# Patient Record
Sex: Male | Born: 1960
Health system: Southern US, Community
[De-identification: ages and names within clinical notes are randomized; demographics above are authoritative.]

## PROBLEM LIST (undated history)

## (undated) ENCOUNTER — Emergency Department (HOSPITAL_COMMUNITY): Payer: Medicaid Other

## (undated) ENCOUNTER — Emergency Department: Discharge status: Absent without leave | Payer: No Typology Code available for payment source

## (undated) DIAGNOSIS — I10 Essential (primary) hypertension: Secondary | ICD-10-CM

## (undated) DIAGNOSIS — K219 Gastro-esophageal reflux disease without esophagitis: Secondary | ICD-10-CM

## (undated) DIAGNOSIS — Z720 Tobacco use: Secondary | ICD-10-CM

## (undated) DIAGNOSIS — E785 Hyperlipidemia, unspecified: Secondary | ICD-10-CM

## (undated) HISTORY — DX: Hyperlipidemia, unspecified: E78.5

## (undated) HISTORY — DX: Tobacco use: Z72.0

## (undated) HISTORY — DX: Essential (primary) hypertension: I10

## (undated) HISTORY — DX: Gastro-esophageal reflux disease without esophagitis: K21.9

---

## 1999-12-14 ENCOUNTER — Emergency Department (HOSPITAL_COMMUNITY): Admission: EM | Admit: 1999-12-14 | Discharge: 1999-12-14 | Payer: Self-pay | Admitting: Emergency Medicine

## 2006-06-05 ENCOUNTER — Ambulatory Visit: Payer: Self-pay | Admitting: *Deleted

## 2006-06-05 ENCOUNTER — Ambulatory Visit: Payer: Self-pay | Admitting: Family Medicine

## 2006-07-17 ENCOUNTER — Ambulatory Visit: Payer: Self-pay | Admitting: Family Medicine

## 2006-09-11 ENCOUNTER — Ambulatory Visit: Payer: Self-pay | Admitting: Family Medicine

## 2006-11-05 ENCOUNTER — Ambulatory Visit: Payer: Self-pay | Admitting: Family Medicine

## 2007-01-21 ENCOUNTER — Ambulatory Visit: Payer: Self-pay | Admitting: Family Medicine

## 2007-03-11 ENCOUNTER — Ambulatory Visit: Payer: Self-pay | Admitting: Family Medicine

## 2007-05-28 ENCOUNTER — Ambulatory Visit: Payer: Self-pay | Admitting: Family Medicine

## 2007-09-11 ENCOUNTER — Encounter (INDEPENDENT_AMBULATORY_CARE_PROVIDER_SITE_OTHER): Payer: Self-pay | Admitting: *Deleted

## 2007-11-15 ENCOUNTER — Ambulatory Visit: Payer: Self-pay | Admitting: Internal Medicine

## 2007-12-16 ENCOUNTER — Emergency Department (HOSPITAL_COMMUNITY): Admission: EM | Admit: 2007-12-16 | Discharge: 2007-12-16 | Payer: Self-pay | Admitting: Emergency Medicine

## 2008-01-13 ENCOUNTER — Ambulatory Visit: Payer: Self-pay | Admitting: Internal Medicine

## 2008-01-13 LAB — CONVERTED CEMR LAB
ALT: 40 units/L (ref 0–53)
AST: 22 units/L (ref 0–37)
CO2: 28 meq/L (ref 19–32)
Calcium: 9 mg/dL (ref 8.4–10.5)
Chloride: 101 meq/L (ref 96–112)
Cholesterol: 181 mg/dL (ref 0–200)
Sodium: 141 meq/L (ref 135–145)
Total Bilirubin: 0.7 mg/dL (ref 0.3–1.2)
Total Protein: 6.8 g/dL (ref 6.0–8.3)
VLDL: 17 mg/dL (ref 0–40)

## 2008-02-17 ENCOUNTER — Ambulatory Visit: Payer: Self-pay | Admitting: Internal Medicine

## 2008-05-11 ENCOUNTER — Ambulatory Visit: Payer: Self-pay | Admitting: Internal Medicine

## 2008-11-18 ENCOUNTER — Encounter (INDEPENDENT_AMBULATORY_CARE_PROVIDER_SITE_OTHER): Payer: Self-pay | Admitting: Adult Health

## 2008-11-18 ENCOUNTER — Ambulatory Visit: Payer: Self-pay | Admitting: Internal Medicine

## 2008-11-18 LAB — CONVERTED CEMR LAB
AST: 19 units/L (ref 0–37)
Albumin: 4.5 g/dL (ref 3.5–5.2)
BUN: 14 mg/dL (ref 6–23)
Basophils Relative: 1 % (ref 0–1)
CO2: 26 meq/L (ref 19–32)
Calcium: 9.5 mg/dL (ref 8.4–10.5)
Chloride: 99 meq/L (ref 96–112)
Cholesterol: 142 mg/dL (ref 0–200)
Creatinine, Ser: 0.99 mg/dL (ref 0.40–1.50)
Glucose, Bld: 142 mg/dL — ABNORMAL HIGH (ref 70–99)
HDL: 41 mg/dL (ref >39)
Hemoglobin: 16.1 g/dL (ref 13.0–17.0)
Lymphs Abs: 1.7 10*3/uL (ref 0.7–4.0)
MCHC: 33.6 g/dL (ref 30.0–36.0)
Monocytes Absolute: 0.4 10*3/uL (ref 0.1–1.0)
Monocytes Relative: 8 % (ref 3–12)
Neutro Abs: 2.7 10*3/uL (ref 1.7–7.7)
Potassium: 3.8 meq/L (ref 3.5–5.3)
RBC: 5.55 M/uL (ref 4.22–5.81)
Total CHOL/HDL Ratio: 3.5
Triglycerides: 124 mg/dL (ref <150)
WBC: 4.9 10*3/uL (ref 4.0–10.5)

## 2008-12-16 ENCOUNTER — Ambulatory Visit: Payer: Self-pay | Admitting: Internal Medicine

## 2009-01-28 ENCOUNTER — Encounter (INDEPENDENT_AMBULATORY_CARE_PROVIDER_SITE_OTHER): Payer: Self-pay | Admitting: Adult Health

## 2009-01-28 ENCOUNTER — Ambulatory Visit: Payer: Self-pay | Admitting: Internal Medicine

## 2009-01-28 LAB — CONVERTED CEMR LAB
Albumin: 4.3 g/dL (ref 3.5–5.2)
Alkaline Phosphatase: 94 units/L (ref 39–117)
BUN: 18 mg/dL (ref 6–23)
CO2: 25 meq/L (ref 19–32)
Calcium: 9 mg/dL (ref 8.4–10.5)
Chloride: 105 meq/L (ref 96–112)
Glucose, Bld: 111 mg/dL — ABNORMAL HIGH (ref 70–99)
HDL: 44 mg/dL (ref >39)
LDL Cholesterol: 74 mg/dL (ref 0–99)
PSA: 0.36 ng/mL (ref 0.10–4.00)
Potassium: 3.5 meq/L (ref 3.5–5.3)
Sodium: 142 meq/L (ref 135–145)
Total Protein: 7.2 g/dL (ref 6.0–8.3)
Triglycerides: 58 mg/dL (ref <150)

## 2009-05-17 ENCOUNTER — Ambulatory Visit: Payer: Self-pay | Admitting: Internal Medicine

## 2009-05-18 ENCOUNTER — Ambulatory Visit: Payer: Self-pay | Admitting: Internal Medicine

## 2009-07-01 ENCOUNTER — Ambulatory Visit: Payer: Self-pay | Admitting: Internal Medicine

## 2010-02-21 ENCOUNTER — Ambulatory Visit: Payer: Self-pay | Admitting: Internal Medicine

## 2010-02-21 ENCOUNTER — Encounter (INDEPENDENT_AMBULATORY_CARE_PROVIDER_SITE_OTHER): Payer: Self-pay | Admitting: Adult Health

## 2010-02-21 LAB — CONVERTED CEMR LAB
ALT: 34 units/L (ref 0–53)
AST: 28 units/L (ref 0–37)
Albumin: 4.7 g/dL (ref 3.5–5.2)
BUN: 16 mg/dL (ref 6–23)
CO2: 27 meq/L (ref 19–32)
Calcium: 9.8 mg/dL (ref 8.4–10.5)
Chloride: 100 meq/L (ref 96–112)
Cholesterol: 137 mg/dL (ref 0–200)
Creatinine, Ser: 0.85 mg/dL (ref 0.40–1.50)
Hgb A1c MFr Bld: 5.9 % (ref 4.6–6.1)
Potassium: 3.7 meq/L (ref 3.5–5.3)

## 2011-01-02 ENCOUNTER — Encounter (INDEPENDENT_AMBULATORY_CARE_PROVIDER_SITE_OTHER): Payer: Self-pay | Admitting: *Deleted

## 2011-01-02 LAB — CONVERTED CEMR LAB
ALT: 24 units/L (ref 0–53)
AST: 19 units/L (ref 0–37)
Albumin: 4.8 g/dL (ref 3.5–5.2)
Alkaline Phosphatase: 124 units/L — ABNORMAL HIGH (ref 39–117)
Calcium: 9.2 mg/dL (ref 8.4–10.5)
Chloride: 98 meq/L (ref 96–112)
LDL Cholesterol: 83 mg/dL (ref 0–99)
Potassium: 3.2 meq/L — ABNORMAL LOW (ref 3.5–5.3)
Sodium: 138 meq/L (ref 135–145)
Total Protein: 7.5 g/dL (ref 6.0–8.3)

## 2013-06-10 ENCOUNTER — Ambulatory Visit: Payer: No Typology Code available for payment source | Attending: Family Medicine | Admitting: Internal Medicine

## 2013-06-10 ENCOUNTER — Encounter: Payer: Self-pay | Admitting: Internal Medicine

## 2013-06-10 VITALS — BP 196/124 | HR 70 | Temp 98.4°F | Resp 15 | Ht 66.0 in | Wt 166.0 lb

## 2013-06-10 DIAGNOSIS — E785 Hyperlipidemia, unspecified: Secondary | ICD-10-CM

## 2013-06-10 DIAGNOSIS — R1013 Epigastric pain: Secondary | ICD-10-CM | POA: Insufficient documentation

## 2013-06-10 DIAGNOSIS — I10 Essential (primary) hypertension: Secondary | ICD-10-CM | POA: Insufficient documentation

## 2013-06-10 DIAGNOSIS — R109 Unspecified abdominal pain: Secondary | ICD-10-CM

## 2013-06-10 DIAGNOSIS — K219 Gastro-esophageal reflux disease without esophagitis: Secondary | ICD-10-CM

## 2013-06-10 DIAGNOSIS — Z720 Tobacco use: Secondary | ICD-10-CM | POA: Insufficient documentation

## 2013-06-10 DIAGNOSIS — F17200 Nicotine dependence, unspecified, uncomplicated: Secondary | ICD-10-CM | POA: Insufficient documentation

## 2013-06-10 MED ORDER — LISINOPRIL-HYDROCHLOROTHIAZIDE 20-25 MG PO TABS: 1.0000 | ORAL_TABLET | Freq: Every day | ORAL | Status: DC

## 2013-06-10 MED ORDER — RANITIDINE HCL 150 MG PO TABS: 150.0000 mg | ORAL_TABLET | Freq: Two times a day (BID) | ORAL | Status: DC

## 2013-06-10 NOTE — Progress Notes (Signed)
Patient complains of stomach ache past three days Stated had a bowel movement earlier today Denies fever

## 2013-06-10 NOTE — Progress Notes (Signed)
Patient ID: Frank Warren, male   DOB: [birth year], 52 y.o.   MRN: [id number] PCP:  No primary provider on file.    Chief Complaint:  Epigastric pain for 2-3 days  HPI: 52 year old male with hx of HTN, ? GERD and dyslipidemia presents to the clinic with symptoms of epigastric discomfort associated with 2-3 episodes of loose bowel movement for past 3 days. Patient reports of burning epigastric pain for this duration, it is nonradiating. Denies any nausea and vomiting. Denies symptoms similar to his acid reflux. He denied eating anything outside. Patient had 3 episodes of loose bowel movement for past 3 days without any blood. Denies any sick contacts. Denies fever, chills, headache, blurred vision, chest pain, palpitations, shortness of breath, bowel or urinary symptoms. Denies any joint pain. He reports having history of hypertension and being on lisinopril (does not remember dose and has a lot of prescription since almost 2 months after the [facility] at [address] closed ). He also reports history of dyslipidemia and acid reflux symptoms. He continues to smoke half a pack of cigarette per day.  Allergies: No Known Allergies  Medications prescribed this visit  Medication Sig Start Date End Date Taking? Authorizing Provider  lisinopril-hydrochlorothiazide (PRINZIDE,ZESTORETIC) 20-25 MG per tablet Take 1 tablet by mouth daily. 06/10/13   Jenniferann Stuckert, MD  ranitidine (ZANTAC) 150 MG tablet Take 1 tablet (150 mg total) by mouth 2 (two) times daily. 06/10/13   Eddie North, MD    Past Medical History  Diagnosis Date  . Hypertension   . Hyperlipidemia   . Acid reflux disease   . Dyslipidemia   . Tobacco abuse     History reviewed. No pertinent past surgical history.  Social History:  reports that he has been smoking Cigarettes.  He has been smoking about 0.50 packs per day. He does not have any smokeless tobacco history on file. His alcohol and drug histories are not on file.  History  reviewed. No pertinent family history.  Review of Systems:  Constitutional: Denies fever, chills, diaphoresis, appetite change and fatigue.  HEENT: Denies photophobia, eye pain, redness, hearing loss, ear pain, congestion, sore throat, rhinorrhea, sneezing, mouth sores, trouble swallowing, neck pain, neck stiffness and tinnitus.   Respiratory: Denies SOB, DOE, cough, chest tightness,  and wheezing.   Cardiovascular: Denies chest pain, palpitations and leg swelling.  Gastrointestinal: Epigastric pain with iarrhea, Denies nausea, vomiting,  constipation, blood in stool and abdominal distention.  Genitourinary: Denies dysuria, urgency, frequency, hematuria, flank pain and difficulty urinating.  Endocrine: Denies: hot or cold intolerance,  polyuria, polydipsia. Musculoskeletal: Denies myalgias, back pain, joint swelling, arthralgias. Neurological: Denies dizziness, seizures, syncope, weakness, light-headedness, numbness and headaches.  Psychiatric/Behavioral: Denies , mood changes, confusion, nervousness, sleep disturbance and agitation   Physical Exam:  Filed Vitals:   06/10/13 1848  BP: 196/124  Pulse: 70  Temp: 98.4 F (36.9 C)  Resp: 15  Height: 5\' 6"  (1.676 m)  Weight: 166 lb (75.297 kg)  SpO2: 97%    Constitutional: Vital signs reviewed.  Patient is a well-developed and well-nourished in no acute distress and cooperative with exam. Alert and oriented x3.  HEENT: No pallor, moist oral mucosa Chest: Clear to auscultation bilaterally, no added sounds CVS: Normal S1 and S2, no murmurs rub or gallop Abdomen: Soft, distended, bowel sounds present, mild epigastric tenderness Extremities: Warm, no edema CNS: AAO x3   No results found for this or any previous visit (from the past 48 hour(s)).  Radiological Exams  No results found.  Assessment/Plan Abdominal pain Appears to be related to GERD like symptoms. Has unexplained loose BM for 3 days which seems to be improving. Will  prescribe a course of ranitidine.   Accelerated hypertension Blood pressure markedly elevated.  No associated symptoms. I will prescribe him with a course of HCTZ-lisinopril (25- 20 mg) once daily. Patient has been off his blood pressure medications for almost 2 months now. He is also actively smoking. I would check for a CBC, BMET., lipid panel and hemoglobin A1c. I have encouraged and counseled  him to stop smoking.  follow up in 3 days for BP monitoring. He should report to ED if he has any symptoms of chest pain, shortness of breath, nausea, vomiting, dizziness, severe headache, hematuria  or blurred vision.  counseled on low salt diet  Dyslipidemia Will check lipid panel and A1C  i will ask to have records from [facility] at [address] set here.  Follow up in 3 days for BP monitoring   Time spent: 30 minutes  Marlyce Mcdougald 06/10/2013, 7:31 PM

## 2013-06-12 ENCOUNTER — Ambulatory Visit: Payer: No Typology Code available for payment source | Attending: Family Medicine | Admitting: Internal Medicine

## 2013-06-12 VITALS — BP 197/115 | HR 54 | Temp 97.9°F | Ht 69.0 in | Wt 166.9 lb

## 2013-06-12 DIAGNOSIS — I10 Essential (primary) hypertension: Secondary | ICD-10-CM

## 2013-06-12 LAB — COMPREHENSIVE METABOLIC PANEL
ALT: 15 U/L (ref 0–53)
Albumin: 4.2 g/dL (ref 3.5–5.2)
BUN: 10 mg/dL (ref 6–23)
CO2: 30 mEq/L (ref 19–32)
Calcium: 9.6 mg/dL (ref 8.4–10.5)
Chloride: 98 mEq/L (ref 96–112)
Creat: 1.01 mg/dL (ref 0.50–1.35)
Potassium: 3.2 mEq/L — ABNORMAL LOW (ref 3.5–5.3)

## 2013-06-12 LAB — LIPID PANEL
Cholesterol: 169 mg/dL (ref 0–200)
LDL Cholesterol: 110 mg/dL — ABNORMAL HIGH (ref 0–99)
Triglycerides: 84 mg/dL (ref <150)

## 2013-06-12 LAB — HEMOGLOBIN A1C: Hgb A1c MFr Bld: 5.6 % (ref <5.7)

## 2013-06-12 MED ORDER — CLONIDINE HCL 0.3 MG PO TABS: 0.3000 mg | ORAL_TABLET | Freq: Two times a day (BID) | ORAL | Status: DC

## 2013-06-12 NOTE — Progress Notes (Signed)
Patient ID: Frank Warren, male   DOB: 1961-08-23, 52 y.o.   MRN: [id number]   CC:  HPI: 52 year old male, with a history of GERD, dyslipidemia presents to the clinic for followup of blood pressure. The patient's blood pressure significantly elevated in the 190s. He showed me his medications and he used to be on Norvasc, lisinopril/HCTZ and atenolol. He recently started back on lisinopril HCTZ after a long gap. Today's blood pressure still significantly elevated. He denies any chest pain palpitations, shortness of breath   No Known Allergies Past Medical History  Diagnosis Date  . Hypertension   . Hyperlipidemia   . Acid reflux disease   . Dyslipidemia   . Tobacco abuse    Current Outpatient Prescriptions on File Prior to Visit  Medication Sig Dispense Refill  . lisinopril-hydrochlorothiazide (PRINZIDE,ZESTORETIC) 20-25 MG per tablet Take 1 tablet by mouth daily.  30 tablet  3  . ranitidine (ZANTAC) 150 MG tablet Take 1 tablet (150 mg total) by mouth 2 (two) times daily.  60 tablet  0   No current facility-administered medications on file prior to visit.   No family history on file. History   Social History  . Marital Status: Married    Spouse Name: N/A    Number of Children: N/A  . Years of Education: N/A   Occupational History  . Not on file.   Social History Main Topics  . Smoking status: Current Every Day Smoker -- 0.50 packs/day    Types: Cigarettes  . Smokeless tobacco: Not on file  . Alcohol Use: Not on file  . Drug Use: Not on file  . Sexually Active: Not on file   Other Topics Concern  . Not on file   Social History Narrative  . No narrative on file    Review of Systems  Constitutional: Negative for fever, chills, diaphoresis, activity change, appetite change and fatigue.  HENT: Negative for ear pain, nosebleeds, congestion, facial swelling, rhinorrhea, neck pain, neck stiffness and ear discharge.   Eyes: Negative for pain, discharge, redness, itching and  visual disturbance.  Respiratory: Negative for cough, choking, chest tightness, shortness of breath, wheezing and stridor.   Cardiovascular: Negative for chest pain, palpitations and leg swelling.  Gastrointestinal: Negative for abdominal distention.  Genitourinary: Negative for dysuria, urgency, frequency, hematuria, flank pain, decreased urine volume, difficulty urinating and dyspareunia.  Musculoskeletal: Negative for back pain, joint swelling, arthralgias and gait problem.  Neurological: Negative for dizziness, tremors, seizures, syncope, facial asymmetry, speech difficulty, weakness, light-headedness, numbness and headaches.  Hematological: Negative for adenopathy. Does not bruise/bleed easily.  Psychiatric/Behavioral: Negative for hallucinations, behavioral problems, confusion, dysphoric mood, decreased concentration and agitation.    Objective:   Filed Vitals:   06/12/13 0951  BP: 197/115  Pulse: 54  Temp: 97.9 F (36.6 C)    Physical Exam  Constitutional: Appears well-developed and well-nourished. No distress.  HENT: Normocephalic. External right and left ear normal. Oropharynx is clear and moist.  Eyes: Conjunctivae and EOM are normal. PERRLA, no scleral icterus.  Neck: Normal ROM. Neck supple. No JVD. No tracheal deviation. No thyromegaly.  CVS: RRR, S1/S2 +, no murmurs, no gallops, no carotid bruit.  Pulmonary: Effort and breath sounds normal, no stridor, rhonchi, wheezes, rales.  Abdominal: Soft. BS +,  no distension, tenderness, rebound or guarding.  Musculoskeletal: Normal range of motion. No edema and no tenderness.  Lymphadenopathy: No lymphadenopathy noted, cervical, inguinal. Neuro: Alert. Normal reflexes, muscle tone coordination. No cranial nerve deficit. Skin: Skin is  warm and dry. No rash noted. Not diaphoretic. No erythema. No pallor.  Psychiatric: Normal mood and affect. Behavior, judgment, thought content normal.   Lab Results  Component Value Date   WBC  4.9 11/18/2008   HGB 16.1 11/18/2008   HCT 47.9 11/18/2008   MCV 86.3 11/18/2008   PLT 190 11/18/2008   Lab Results  Component Value Date   CREATININE 0.86 01/02/2011   BUN 10 01/02/2011   NA 138 01/02/2011   K 3.2* 01/02/2011   CL 98 01/02/2011   CO2 28 01/02/2011    Lab Results  Component Value Date   HGBA1C 5.9 02/21/2010   Lipid Panel     Component Value Date/Time   CHOL 145 01/02/2011 2107   TRIG 72 01/02/2011 2107   HDL 48 01/02/2011 2107   CHOLHDL 3.0 Ratio 01/02/2011 2107   VLDL 14 01/02/2011 2107   LDLCALC 83 01/02/2011 2107       Assessment and plan:   Patient Active Problem List   Diagnosis Date Noted  . Abdominal  pain, other specified site 06/10/2013  . Accelerated hypertension 06/10/2013  . Dyslipidemia 06/10/2013  . Acid reflux disease 06/10/2013  . Tobacco abuse disorder 06/10/2013       Accelerated hypertension The patient is recently started on HCTZ and lisinopril Add clonidine. He has been advised about quitting smoking. I see that his labs were ordered but have not been done, will reorder blood work for today Repeat blood pressure check on Monday

## 2013-06-16 ENCOUNTER — Ambulatory Visit: Payer: No Typology Code available for payment source | Attending: Family Medicine

## 2013-06-16 ENCOUNTER — Other Ambulatory Visit: Payer: Self-pay | Admitting: Internal Medicine

## 2013-06-16 MED ORDER — CLONIDINE HCL 0.3 MG PO TABS: ORAL_TABLET | ORAL | Status: DC

## 2013-06-16 NOTE — Progress Notes (Unsigned)
Pt came in for bp recheck. Medication effective for bp. 167/93 . HR 50. Dr. Sherlon Handing notified. Pt to take 1/2 Catapress and return on Wednesday per order

## 2013-06-16 NOTE — Progress Notes (Signed)
Please note that due to bradycardia of 48 on today's exam we have decided to lower the dose of Clonidine to 1.5 mg tablet BID and to have pt come back in 2 days for vitals sign check. He may need to have referral to cardiology. Pt is otherwise asymptomatic on exam.  Debbora Presto, MD  [facility] Pager [phone]  If 7PM-7AM, please contact night-coverage [email or web address] Password [email or web address]

## 2013-06-18 ENCOUNTER — Ambulatory Visit: Payer: No Typology Code available for payment source | Attending: Family Medicine

## 2013-06-18 NOTE — Progress Notes (Unsigned)
Subjective:     Patient ID: Frank Warren, male   DOB: February 11, 1961, 52 y.o.   MRN: [id number]  HPI   Review of Systems     Objective:   Physical Exam     Assessment:     ***    Plan:     ***

## 2013-06-18 NOTE — Progress Notes (Unsigned)
PT HERE FOR BP CHECK WITH H/R. BP 169/106 65. PT TOLD TO CONTINUE TAKING BP MED AS PRESCRIBED AND RETURN ON Monday FOR OFFICE VISIT PER DR. Laural Benes. DENIES H/A,N,V,DIZZINESS

## 2013-06-23 ENCOUNTER — Ambulatory Visit: Payer: No Typology Code available for payment source

## 2013-06-25 ENCOUNTER — Ambulatory Visit: Payer: No Typology Code available for payment source | Attending: Family Medicine | Admitting: Internal Medicine

## 2013-06-25 VITALS — BP 171/109 | HR 51 | Temp 98.3°F | Resp 15 | Ht 71.0 in | Wt 167.0 lb

## 2013-06-25 DIAGNOSIS — I10 Essential (primary) hypertension: Secondary | ICD-10-CM

## 2013-06-25 MED ORDER — HYDRALAZINE HCL 25 MG PO TABS: 100.0000 mg | ORAL_TABLET | Freq: Three times a day (TID) | ORAL | Status: DC

## 2013-06-25 MED ORDER — NICOTINE 21 MG/24HR TD PT24: 1.0000 | MEDICATED_PATCH | TRANSDERMAL | Status: DC

## 2013-06-25 NOTE — Progress Notes (Unsigned)
Patient is here for follow up for HTN Today blood pressure is still elevated

## 2013-06-25 NOTE — Progress Notes (Unsigned)
Patient ID: Frank Warren, male   DOB: 12/19/1961, 52 y.o.   MRN: [id number]  CC:  HPI:  52 year old male, with a history of GERD, dyslipidemia presents to the clinic for followup of blood pressure. The patient's blood pressure significantly elevated in the 190s. He showed me his medications and he used to be on Norvasc, lisinopril/HCTZ and atenolol. He recently started back on lisinopril HCTZ after a long gap. He was initiated on clonidine. With the clonidine the patient's heart rate dropped to 48. Today his heart rate is 51 after cutting back on the clonidine to half the dose. Patient's labs reflect hypokalemia. Today's blood pressure still significantly elevated. He denies any chest pain palpitations, shortness of breath   No Known Allergies Past Medical History  Diagnosis Date  . Hypertension   . Hyperlipidemia   . Acid reflux disease   . Dyslipidemia   . Tobacco abuse    Current Outpatient Prescriptions on File Prior to Visit  Medication Sig Dispense Refill  . cloNIDine (CATAPRES) 0.3 MG tablet Take half tablet twice daily  60 tablet  2  . lisinopril-hydrochlorothiazide (PRINZIDE,ZESTORETIC) 20-25 MG per tablet Take 1 tablet by mouth daily.  30 tablet  3  . ranitidine (ZANTAC) 150 MG tablet Take 1 tablet (150 mg total) by mouth 2 (two) times daily.  60 tablet  0   No current facility-administered medications on file prior to visit.   History reviewed. No pertinent family history. History   Social History  . Marital Status: Married    Spouse Name: N/A    Number of Children: N/A  . Years of Education: N/A   Occupational History  . Not on file.   Social History Main Topics  . Smoking status: Current Every Day Smoker -- 0.50 packs/day    Types: Cigarettes  . Smokeless tobacco: Not on file  . Alcohol Use: Not on file  . Drug Use: Not on file  . Sexually Active: Not on file   Other Topics Concern  . Not on file   Social History Narrative  . No narrative on file     Review of Systems  Constitutional: Negative for fever, chills, diaphoresis, activity change, appetite change and fatigue.  HENT: Negative for ear pain, nosebleeds, congestion, facial swelling, rhinorrhea, neck pain, neck stiffness and ear discharge.   Eyes: Negative for pain, discharge, redness, itching and visual disturbance.  Respiratory: Negative for cough, choking, chest tightness, shortness of breath, wheezing and stridor.   Cardiovascular: Negative for chest pain, palpitations and leg swelling.  Gastrointestinal: Negative for abdominal distention.  Genitourinary: Negative for dysuria, urgency, frequency, hematuria, flank pain, decreased urine volume, difficulty urinating and dyspareunia.  Musculoskeletal: Negative for back pain, joint swelling, arthralgias and gait problem.  Neurological: Negative for dizziness, tremors, seizures, syncope, facial asymmetry, speech difficulty, weakness, light-headedness, numbness and headaches.  Hematological: Negative for adenopathy. Does not bruise/bleed easily.  Psychiatric/Behavioral: Negative for hallucinations, behavioral problems, confusion, dysphoric mood, decreased concentration and agitation.    Objective:   Filed Vitals:   06/25/13 1528  BP: 171/109  Pulse: 51  Temp: 98.3 F (36.8 C)  Resp: 15    Physical Exam  Constitutional: Appears well-developed and well-nourished. No distress.  HENT: Normocephalic. External right and left ear normal. Oropharynx is clear and moist.  Eyes: Conjunctivae and EOM are normal. PERRLA, no scleral icterus.  Neck: Normal ROM. Neck supple. No JVD. No tracheal deviation. No thyromegaly.  CVS: RRR, S1/S2 +, no murmurs, no gallops, no carotid bruit.  Pulmonary: Effort and breath sounds normal, no stridor, rhonchi, wheezes, rales.  Abdominal: Soft. BS +,  no distension, tenderness, rebound or guarding.  Musculoskeletal: Normal range of motion. No edema and no tenderness.  Lymphadenopathy: No  lymphadenopathy noted, cervical, inguinal. Neuro: Alert. Normal reflexes, muscle tone coordination. No cranial nerve deficit. Skin: Skin is warm and dry. No rash noted. Not diaphoretic. No erythema. No pallor.  Psychiatric: Normal mood and affect. Behavior, judgment, thought content normal.   Lab Results  Component Value Date   WBC 4.9 11/18/2008   HGB 16.1 11/18/2008   HCT 47.9 11/18/2008   MCV 86.3 11/18/2008   PLT 190 11/18/2008   Lab Results  Component Value Date   CREATININE 1.01 06/12/2013   BUN 10 06/12/2013   NA 138 06/12/2013   K 3.2* 06/12/2013   CL 98 06/12/2013   CO2 30 06/12/2013    Lab Results  Component Value Date   HGBA1C 5.6 06/12/2013   Lipid Panel     Component Value Date/Time   CHOL 169 06/12/2013 1021   TRIG 84 06/12/2013 1021   HDL 42 06/12/2013 1021   CHOLHDL 4.0 06/12/2013 1021   VLDL 17 06/12/2013 1021   LDLCALC 110* 06/12/2013 1021       Assessment and plan:   Patient Active Problem List   Diagnosis Date Noted  . Abdominal  pain, other specified site 06/10/2013  . Accelerated hypertension 06/10/2013  . Dyslipidemia 06/10/2013  . Acid reflux disease 06/10/2013  . Tobacco abuse disorder 06/10/2013       Accelerated hypertension Patient Frank Warren still uncontrolled Will add hydralazine 100 mg 3 times a day Patient advised to come back for blood pressure check in a one-week In the meantime we'll evaluate for resistant hypertension, #1 MRA to evaluate for renal artery stenosis #2 check TSH, repeat BMP, and renin/aldosterone ratio Followup in one week with provider

## 2013-06-26 LAB — TSH: TSH: 0.696 u[IU]/mL (ref 0.350–4.500)

## 2013-06-26 LAB — BASIC METABOLIC PANEL
CO2: 28 mEq/L (ref 19–32)
Calcium: 9.2 mg/dL (ref 8.4–10.5)
Creat: 0.98 mg/dL (ref 0.50–1.35)
Sodium: 140 mEq/L (ref 135–145)

## 2013-07-01 ENCOUNTER — Ambulatory Visit (HOSPITAL_COMMUNITY)
Admission: RE | Admit: 2013-07-01 | Discharge: 2013-07-01 | Disposition: A | Discharge status: Home / Self Care | Payer: No Typology Code available for payment source | Source: Ambulatory Visit | Attending: Internal Medicine | Admitting: Internal Medicine

## 2013-07-01 DIAGNOSIS — K7689 Other specified diseases of liver: Secondary | ICD-10-CM | POA: Insufficient documentation

## 2013-07-01 DIAGNOSIS — I10 Essential (primary) hypertension: Secondary | ICD-10-CM | POA: Insufficient documentation

## 2013-07-01 LAB — ACTH STIMULATION, 3 TIME POINTS

## 2013-07-02 ENCOUNTER — Ambulatory Visit: Payer: No Typology Code available for payment source | Attending: Family Medicine | Admitting: Family Medicine

## 2013-07-02 VITALS — BP 172/105 | HR 56 | Temp 98.0°F | Resp 16 | Wt 167.6 lb

## 2013-07-02 DIAGNOSIS — Z79899 Other long term (current) drug therapy: Secondary | ICD-10-CM | POA: Insufficient documentation

## 2013-07-02 DIAGNOSIS — K219 Gastro-esophageal reflux disease without esophagitis: Secondary | ICD-10-CM

## 2013-07-02 DIAGNOSIS — Z720 Tobacco use: Secondary | ICD-10-CM

## 2013-07-02 DIAGNOSIS — Z09 Encounter for follow-up examination after completed treatment for conditions other than malignant neoplasm: Secondary | ICD-10-CM | POA: Insufficient documentation

## 2013-07-02 DIAGNOSIS — I10 Essential (primary) hypertension: Secondary | ICD-10-CM

## 2013-07-02 DIAGNOSIS — F17200 Nicotine dependence, unspecified, uncomplicated: Secondary | ICD-10-CM | POA: Insufficient documentation

## 2013-07-02 DIAGNOSIS — E785 Hyperlipidemia, unspecified: Secondary | ICD-10-CM

## 2013-07-02 NOTE — Progress Notes (Signed)
Patient ID: Frank Warren, male   DOB: 1961/06/04, 52 y.o.   MRN: [id number]  [id number] hypertension  HPI: The patient reports that he has not been able to take his medications for blood pressure as prescribed because he has been fasting 8-10 hours per day for the last several days.  He is celebrating a lid use holiday.  He reports no chest pain or shortness of breath.  No fever chills nausea or vomiting.  He is still smoking cigarettes and reports that he was not able to afford the nicotine patches. The abdominal pain has resolved. No Known Allergies Past Medical History  Diagnosis Date  . Hypertension   . Hyperlipidemia   . Acid reflux disease   . Dyslipidemia   . Tobacco abuse    Current Outpatient Prescriptions on File Prior to Visit  Medication Sig Dispense Refill  . cloNIDine (CATAPRES) 0.3 MG tablet Take half tablet twice daily  60 tablet  2  . hydrALAZINE (APRESOLINE) 25 MG tablet Take 4 tablets (100 mg total) by mouth 3 (three) times daily.  90 tablet  3  . lisinopril-hydrochlorothiazide (PRINZIDE,ZESTORETIC) 20-25 MG per tablet Take 1 tablet by mouth daily.  30 tablet  3  . nicotine (EQL NICOTINE) 21 mg/24hr patch Place 1 patch onto the skin daily.  28 patch  0  . ranitidine (ZANTAC) 150 MG tablet Take 1 tablet (150 mg total) by mouth 2 (two) times daily.  60 tablet  0   No current facility-administered medications on file prior to visit.   History reviewed. No pertinent family history. History   Social History  . Marital Status: Married    Spouse Name: N/A    Number of Children: N/A  . Years of Education: N/A   Occupational History  . Not on file.   Social History Main Topics  . Smoking status: Current Every Day Smoker -- 0.50 packs/day    Types: Cigarettes  . Smokeless tobacco: Not on file  . Alcohol Use: Not on file  . Drug Use: Not on file  . Sexually Active: Not on file   Other Topics Concern  . Not on file   Social History Narrative  . No narrative on  file    Review of Systems  Constitutional: Negative for fever, chills, diaphoresis, activity change, appetite change and fatigue.  HENT: Negative for ear pain, nosebleeds, congestion, facial swelling, rhinorrhea, neck pain, neck stiffness and ear discharge.   Eyes: Negative for pain, discharge, redness, itching and visual disturbance.  Respiratory: Negative for cough, choking, chest tightness, shortness of breath, wheezing and stridor.   Cardiovascular: Negative for chest pain, palpitations and leg swelling.  Gastrointestinal: Negative for abdominal distention.  Genitourinary: Negative for dysuria, urgency, frequency, hematuria, flank pain, decreased urine volume, difficulty urinating and dyspareunia.  Musculoskeletal: Negative for back pain, joint swelling, arthralgias and gait problem.  Neurological: Negative for dizziness, tremors, seizures, syncope, facial asymmetry, speech difficulty, weakness, light-headedness, numbness and headaches.  Hematological: Negative for adenopathy. Does not bruise/bleed easily.  Psychiatric/Behavioral: Negative for hallucinations, behavioral problems, confusion, dysphoric mood, decreased concentration and agitation.    Objective:   Filed Vitals:   07/02/13 1512  BP: 172/105  Pulse: 56  Temp: 98 F (36.7 C)  Resp: 16    Physical Exam  Constitutional: Appears well-developed and well-nourished. No distress.  HENT: Normocephalic. External right and left ear normal. Oropharynx is clear and moist.  Eyes: Conjunctivae and EOM are normal. PERRLA, no scleral icterus.  Neck: Normal ROM. Neck  supple. No JVD. No tracheal deviation. No thyromegaly.  CVS: RRR, S1/S2 +, no murmurs, no gallops, no carotid bruit.  Pulmonary: Effort and breath sounds normal, no stridor, rhonchi, wheezes, rales.  Abdominal: Soft. BS +,  no distension, tenderness, rebound or guarding.  Musculoskeletal: Normal range of motion. No edema and no tenderness.  Lymphadenopathy: No  lymphadenopathy noted, cervical, inguinal. Neuro: Alert. Normal reflexes, muscle tone coordination. No cranial nerve deficit. Skin: Skin is warm and dry. No rash noted. Not diaphoretic. No erythema. No pallor.  Psychiatric: Normal mood and affect. Behavior, judgment, thought content normal.   Lab Results  Component Value Date   WBC 4.9 11/18/2008   HGB 16.1 11/18/2008   HCT 47.9 11/18/2008   MCV 86.3 11/18/2008   PLT 190 11/18/2008   Lab Results  Component Value Date   CREATININE 0.98 06/25/2013   BUN 11 06/25/2013   NA 140 06/25/2013   K 3.7 06/25/2013   CL 99 06/25/2013   CO2 28 06/25/2013    Lab Results  Component Value Date   HGBA1C 5.6 06/12/2013   Lipid Panel     Component Value Date/Time   CHOL 169 06/12/2013 1021   TRIG 84 06/12/2013 1021   HDL 42 06/12/2013 1021   CHOLHDL 4.0 06/12/2013 1021   VLDL 17 06/12/2013 1021   LDLCALC 110* 06/12/2013 1021       Assessment and plan:   Patient Active Problem List   Diagnosis Date Noted  . Abdominal  pain, other specified site 06/10/2013  . Accelerated hypertension 06/10/2013  . Dyslipidemia 06/10/2013  . Acid reflux disease 06/10/2013  . Tobacco abuse disorder 06/10/2013   Continue current treatment plan as pt not taking BP meds as prescribed.  Continue monitoring blood pressure at home and reporting to [place] in 2 weeks' time.  The patient was counseled on the dangers of tobacco use, and was advised to quit.  Reviewed strategies to maximize success, including removing cigarettes and smoking materials from environment and written materials.  The patient was given clear instructions to go to ER or return to medical center if symptoms don't improve, worsen or new problems develop.  The patient verbalized understanding.  The patient was told to call to get any lab results if not heard anything in the next week.    RTC in 2 months  Rodney Langton, MD, CDE, FAAFP [facility] [place], [place]

## 2013-07-02 NOTE — Progress Notes (Signed)
Patient here for follow up on his blood pressure Elevated today 172/105

## 2013-07-02 NOTE — Patient Instructions (Signed)
Smoking Cessation, Tips for Success YOU CAN QUIT SMOKING If you are ready to quit smoking, congratulations! You have chosen to help yourself be healthier. Cigarettes bring nicotine, tar, carbon monoxide, and other irritants into your body. Your lungs, heart, and blood vessels will be able to work better without these poisons. There are many different ways to quit smoking. Nicotine gum, nicotine patches, a nicotine inhaler, or nicotine nasal spray can help with physical craving. Hypnosis, support groups, and medicines help break the habit of smoking. Here are some tips to help you quit for good.  Throw away all cigarettes.  Clean and remove all ashtrays from your home, work, and car.  On a card, write down your reasons for quitting. Carry the card with you and read it when you get the urge to smoke.  Cleanse your body of nicotine. Drink enough water and fluids to keep your urine clear or pale yellow. Do this after quitting to flush the nicotine from your body.  Learn to predict your moods. Do not let a bad situation be your excuse to have a cigarette. Some situations in your life might tempt you into wanting a cigarette.  Never have "just one" cigarette. It leads to wanting another and another. Remind yourself of your decision to quit.  Change habits associated with smoking. If you smoked while driving or when feeling stressed, try other activities to replace smoking. Stand up when drinking your coffee. Brush your teeth after eating. Sit in a different chair when you read the paper. Avoid alcohol while trying to quit, and try to drink fewer caffeinated beverages. Alcohol and caffeine may urge you to smoke.  Avoid foods and drinks that can trigger a desire to smoke, such as sugary or spicy foods and alcohol.  Ask people who smoke not to smoke around you.  Have something planned to do right after eating or having a cup of coffee. Take a walk or exercise to perk you up. This will help to keep you  from overeating.  Try a relaxation exercise to calm you down and decrease your stress. Remember, you may be tense and nervous for the first 2 weeks after you quit, but this will pass.  Find new activities to keep your hands busy. Play with a pen, coin, or rubber band. Doodle or draw things on paper.  Brush your teeth right after eating. This will help cut down on the craving for the taste of tobacco after meals. You can try mouthwash, too.  Use oral substitutes, such as lemon drops, carrots, a cinnamon stick, or chewing gum, in place of cigarettes. Keep them handy so they are available when you have the urge to smoke.  When you have the urge to smoke, try deep breathing.  Designate your home as a nonsmoking area.  If you are a heavy smoker, ask your caregiver about a prescription for nicotine chewing gum. It can ease your withdrawal from nicotine.  Reward yourself. Set aside the cigarette money you save and buy yourself something nice.  Look for support from others. Join a support group or smoking cessation program. Ask someone at home or at work to help you with your plan to quit smoking.  Always ask yourself, "Do I need this cigarette or is this just a reflex?" Tell yourself, "Today, I choose not to smoke," or "I do not want to smoke." You are reminding yourself of your decision to quit, even if you do smoke a cigarette. HOW WILL I FEEL WHEN   I QUIT SMOKING?  The benefits of not smoking start within days of quitting.  You may have symptoms of withdrawal because your body is used to nicotine (the addictive substance in cigarettes). You may crave cigarettes, be irritable, feel very hungry, cough often, get headaches, or have difficulty concentrating.  The withdrawal symptoms are only temporary. They are strongest when you first quit but will go away within 10 to 14 days.  When withdrawal symptoms occur, stay in control. Think about your reasons for quitting. Remind yourself that these are  signs that your body is healing and getting used to being without cigarettes.  Remember that withdrawal symptoms are easier to treat than the major diseases that smoking can cause.  Even after the withdrawal is over, expect periodic urges to smoke. However, these cravings are generally short-lived and will go away whether you smoke or not. Do not smoke!  If you relapse and smoke again, do not lose hope. Most smokers quit 3 times before they are successful.  If you relapse, do not give up! Plan ahead and think about what you will do the next time you get the urge to smoke. LIFE AS A NONSMOKER: MAKE IT FOR A MONTH, MAKE IT FOR LIFE Day 1: Hang this page where you will see it every day. Day 2: Get rid of all ashtrays, matches, and lighters. Day 3: Drink water. Breathe deeply between sips. Day 4: Avoid places with smoke-filled air, such as bars, clubs, or the smoking section of restaurants. Day 5: Keep track of how much money you save by not smoking. Day 6: Avoid boredom. Keep a good book with you or go to the movies. Day 7: Reward yourself! One week without smoking! Day 8: Make a dental appointment to get your teeth cleaned. Day 9: Decide how you will turn down a cigarette before it is offered to you. Day 10: Review your reasons for quitting. Day 11: Distract yourself. Stay active to keep your mind off smoking and to relieve tension. Take a walk, exercise, read a book, do a crossword puzzle, or try a new hobby. Day 12: Exercise. Get off the bus before your stop or use stairs instead of escalators. Day 13: Call on friends for support and encouragement. Day 14: Reward yourself! Two weeks without smoking! Day 15: Practice deep breathing exercises. Day 16: Bet a friend that you can stay a nonsmoker. Day 17: Ask to sit in nonsmoking sections of restaurants. Day 18: Hang up "No Smoking" signs. Day 19: Think of yourself as a nonsmoker. Day 20: Each morning, tell yourself you will not smoke. Day  21: Reward yourself! Three weeks without smoking! Day 22: Think of smoking in negative ways. Remember how it stains your teeth, gives you bad breath, and leaves you short of breath. Day 23: Eat a nutritious breakfast. Day 24:Do not relive your days as a smoker. Day 25: Hold a pencil in your hand when talking on the telephone. Day 26: Tell all your friends you do not smoke. Day 27: Think about how much better food tastes. Day 28: Remember, one cigarette is one too many. Day 29: Take up a hobby that will keep your hands busy. Day 30: Congratulations! One month without smoking! Give yourself a big reward. Your caregiver can direct you to community resources or hospitals for support, which may include:  Group support.  Education.  Hypnosis.  Subliminal therapy. Document Released: 09/08/2004 Document Revised: 03/04/2012 Document Reviewed: 09/27/2009 [facility] Patient Information 2014 [place], [place]. Smoking  Cessation Quitting smoking is important to your health and has many advantages. However, it is not always easy to quit since nicotine is a very addictive drug. Often times, people try 3 times or more before being able to quit. This document explains the best ways for you to prepare to quit smoking. Quitting takes hard work and a lot of effort, but you can do it. ADVANTAGES OF QUITTING SMOKING  You will live longer, feel better, and live better.  Your body will feel the impact of quitting smoking almost immediately.  Within 20 minutes, blood pressure decreases. Your pulse returns to its normal level.  After 8 hours, carbon monoxide levels in the blood return to normal. Your oxygen level increases.  After 24 hours, the chance of having a heart attack starts to decrease. Your breath, hair, and body stop smelling like smoke.  After 48 hours, damaged nerve endings begin to recover. Your sense of taste and smell improve.  After 72 hours, the body is virtually free of nicotine. Your  bronchial tubes relax and breathing becomes easier.  After 2 to 12 weeks, lungs can hold more air. Exercise becomes easier and circulation improves.  The risk of having a heart attack, stroke, cancer, or lung disease is greatly reduced.  After 1 year, the risk of coronary heart disease is cut in half.  After 5 years, the risk of stroke falls to the same as a nonsmoker.  After 10 years, the risk of lung cancer is cut in half and the risk of other cancers decreases significantly.  After 15 years, the risk of coronary heart disease drops, usually to the level of a nonsmoker.  If you are pregnant, quitting smoking will improve your chances of having a healthy baby.  The people you live with, especially any children, will be healthier.  You will have extra money to spend on things other than cigarettes. QUESTIONS TO THINK ABOUT BEFORE ATTEMPTING TO QUIT You may want to talk about your answers with your caregiver.  Why do you want to quit?  If you tried to quit in the past, what helped and what did not?  What will be the most difficult situations for you after you quit? How will you plan to handle them?  Who can help you through the tough times? Your family? Friends? A caregiver?  What pleasures do you get from smoking? What ways can you still get pleasure if you quit? Here are some questions to ask your caregiver:  How can you help me to be successful at quitting?  What medicine do you think would be best for me and how should I take it?  What should I do if I need more help?  What is smoking withdrawal like? How can I get information on withdrawal? GET READY  Set a quit date.  Change your environment by getting rid of all cigarettes, ashtrays, matches, and lighters in your home, car, or work. Do not let people smoke in your home.  Review your past attempts to quit. Think about what worked and what did not. GET SUPPORT AND ENCOURAGEMENT You have a better chance of being  successful if you have help. You can get support in many ways.  Tell your family, friends, and co-workers that you are going to quit and need their support. Ask them not to smoke around you.  Get individual, group, or telephone counseling and support. Programs are available at [employer] and health centers. Call your local health department for  information about programs in your area.  Spiritual beliefs and practices may help some smokers quit.  Download a "quit meter" on your computer to keep track of quit statistics, such as how long you have gone without smoking, cigarettes not smoked, and money saved.  Get a self-help book about quitting smoking and staying off of tobacco. LEARN NEW SKILLS AND BEHAVIORS  Distract yourself from urges to smoke. Talk to someone, go for a walk, or occupy your time with a task.  Change your normal routine. Take a different route to work. Drink tea instead of coffee. Eat breakfast in a different place.  Reduce your stress. Take a hot bath, exercise, or read a book.  Plan something enjoyable to do every day. Reward yourself for not smoking.  Explore interactive web-based programs that specialize in helping you quit. GET MEDICINE AND USE IT CORRECTLY Medicines can help you stop smoking and decrease the urge to smoke. Combining medicine with the above behavioral methods and support can greatly increase your chances of successfully quitting smoking.  Nicotine replacement therapy helps deliver nicotine to your body without the negative effects and risks of smoking. Nicotine replacement therapy includes nicotine gum, lozenges, inhalers, nasal sprays, and skin patches. Some may be available over-the-counter and others require a prescription.  Antidepressant medicine helps people abstain from smoking, but how this works is unknown. This medicine is available by prescription.  Nicotinic receptor partial agonist medicine simulates the effect of nicotine in your  brain. This medicine is available by prescription. Ask your caregiver for advice about which medicines to use and how to use them based on your health history. Your caregiver will tell you what side effects to look out for if you choose to be on a medicine or therapy. Carefully read the information on the package. Do not use any other product containing nicotine while using a nicotine replacement product.  RELAPSE OR DIFFICULT SITUATIONS Most relapses occur within the first 3 months after quitting. Do not be discouraged if you start smoking again. Remember, most people try several times before finally quitting. You may have symptoms of withdrawal because your body is used to nicotine. You may crave cigarettes, be irritable, feel very hungry, cough often, get headaches, or have difficulty concentrating. The withdrawal symptoms are only temporary. They are strongest when you first quit, but they will go away within 10 14 days. To reduce the chances of relapse, try to:  Avoid drinking alcohol. Drinking lowers your chances of successfully quitting.  Reduce the amount of caffeine you consume. Once you quit smoking, the amount of caffeine in your body increases and can give you symptoms, such as a rapid heartbeat, sweating, and anxiety.  Avoid smokers because they can make you want to smoke.  Do not let weight gain distract you. Many smokers will gain weight when they quit, usually less than 10 pounds. Eat a healthy diet and stay active. You can always lose the weight gained after you quit.  Find ways to improve your mood other than smoking. FOR MORE INFORMATION  www.smokefree.gov  Document Released: 12/05/2001 Document Revised: 06/11/2012 Document Reviewed: 03/21/2012 [facility] Patient Information 2014 [place], [place]. DASH Diet The DASH diet stands for "Dietary Approaches to Stop Hypertension." It is a healthy eating plan that has been shown to reduce high blood pressure (hypertension) in as little as  14 days, while also possibly providing other significant health benefits. These other health benefits include reducing the risk of breast cancer after menopause and reducing the  risk of type 2 diabetes, heart disease, colon cancer, and stroke. Health benefits also include weight loss and slowing kidney failure in patients with chronic kidney disease.  DIET GUIDELINES  Limit salt (sodium). Your diet should contain less than 1500 mg of sodium daily.  Limit refined or processed carbohydrates. Your diet should include mostly whole grains. Desserts and added sugars should be used sparingly.  Include small amounts of heart-healthy fats. These types of fats include nuts, oils, and tub margarine. Limit saturated and trans fats. These fats have been shown to be harmful in the body. CHOOSING FOODS  The following food groups are based on a 2000 calorie diet. See your Registered Dietitian for individual calorie needs. Grains and Grain Products (6 to 8 servings daily)  Eat More Often: Whole-wheat bread, brown rice, whole-grain or wheat pasta, quinoa, popcorn without added fat or salt (air popped).  Eat Less Often: White bread, white pasta, white rice, cornbread. Vegetables (4 to 5 servings daily)  Eat More Often: Fresh, frozen, and canned vegetables. Vegetables may be raw, steamed, roasted, or grilled with a minimal amount of fat.  Eat Less Often/Avoid: Creamed or fried vegetables. Vegetables in a cheese sauce. Fruit (4 to 5 servings daily)  Eat More Often: All fresh, canned (in natural juice), or frozen fruits. Dried fruits without added sugar. One hundred percent fruit juice ( cup [237 mL] daily).  Eat Less Often: Dried fruits with added sugar. Canned fruit in light or heavy syrup. [employer], Fish, and Poultry (2 servings or less daily. One serving is 3 to 4 oz [85-114 g]).  Eat More Often: Ninety percent or leaner ground beef, tenderloin, sirloin. Round cuts of beef, chicken breast, [place] breast.  All fish. Grill, bake, or broil your meat. Nothing should be fried.  Eat Less Often/Avoid: Fatty cuts of meat, [place], or chicken leg, thigh, or wing. Fried cuts of meat or fish. Dairy (2 to 3 servings)  Eat More Often: Low-fat or fat-free milk, low-fat plain or light yogurt, reduced-fat or part-skim cheese.  Eat Less Often/Avoid: Milk (whole, 2%).Whole milk yogurt. Full-fat cheeses. Nuts, Seeds, and Legumes (4 to 5 servings per week)  Eat More Often: All without added salt.  Eat Less Often/Avoid: Salted nuts and seeds, canned beans with added salt. Fats and Sweets (limited)  Eat More Often: Vegetable oils, tub margarines without trans fats, sugar-free gelatin. Mayonnaise and salad dressings.  Eat Less Often/Avoid: Coconut oils, palm oils, butter, stick margarine, cream, half and half, cookies, candy, pie. FOR MORE INFORMATION The Dash Diet Eating Plan: www.dashdiet.org Document Released: 11/30/2011 Document Revised: 03/04/2012 Document Reviewed: 11/30/2011 [facility] Patient Information 2014 [place], [place]. Managing Your High Blood Pressure Blood pressure is a measurement of how forceful your blood is pressing against the walls of the arteries. Arteries are muscular tubes within the circulatory system. Blood pressure does not stay the same. Blood pressure rises when you are active, excited, or nervous; and it lowers during sleep and relaxation. If the numbers measuring your blood pressure stay above normal most of the time, you are at risk for health problems. High blood pressure (hypertension) is a long-term (chronic) condition in which blood pressure is elevated. A blood pressure reading is recorded as two numbers, such as 120 over 80 (or 120/80). The first, higher number is called the systolic pressure. It is a measure of the pressure in your arteries as the heart beats. The second, lower number is called the diastolic pressure. It is a measure of the pressure  in your arteries as the  heart relaxes between beats.  Keeping your blood pressure in a normal range is important to your overall health and prevention of health problems, such as heart disease and stroke. When your blood pressure is uncontrolled, your heart has to work harder than normal. High blood pressure is a very common condition in adults because blood pressure tends to rise with age. Men and women are equally likely to have hypertension but at different times in life. Before age 64, men are more likely to have hypertension. After 52 years of age, women are more likely to have it. Hypertension is especially common in African Americans. This condition often has no signs or symptoms. The cause of the condition is usually not known. Your caregiver can help you come up with a plan to keep your blood pressure in a normal, healthy range. BLOOD PRESSURE STAGES Blood pressure is classified into four stages: normal, prehypertension, stage 1, and stage 2. Your blood pressure reading will be used to determine what type of treatment, if any, is necessary. Appropriate treatment options are tied to these four stages:  Normal  Systolic pressure (mm Hg): below 120.  Diastolic pressure (mm Hg): below 80. Prehypertension  Systolic pressure (mm Hg): 120 to 139.  Diastolic pressure (mm Hg): 80 to 89. Stage1  Systolic pressure (mm Hg): 140 to 159.  Diastolic pressure (mm Hg): 90 to 99. Stage2  Systolic pressure (mm Hg): 160 or above.  Diastolic pressure (mm Hg): 100 or above. RISKS RELATED TO HIGH BLOOD PRESSURE Managing your blood pressure is an important responsibility. Uncontrolled high blood pressure can lead to:  A heart attack.  A stroke.  A weakened blood vessel (aneurysm).  Heart failure.  Kidney damage.  Eye damage.  Metabolic syndrome.  Memory and concentration problems. HOW TO MANAGE YOUR BLOOD PRESSURE Blood pressure can be managed effectively with lifestyle changes and medicines (if needed). Your  caregiver will help you come up with a plan to bring your blood pressure within a normal range. Your plan should include the following: Education  Read all information provided by your caregivers about how to control blood pressure.  Educate yourself on the latest guidelines and treatment recommendations. New research is always being done to further define the risks and treatments for high blood pressure. Lifestylechanges  Control your weight.  Avoid smoking.  Stay physically active.  Reduce the amount of salt in your diet.  Reduce stress.  Control any chronic conditions, such as high cholesterol or diabetes.  Reduce your alcohol intake. Medicines  Several medicines (antihypertensive medicines) are available, if needed, to bring blood pressure within a normal range. Communication  Review all the medicines you take with your caregiver because there may be side effects or interactions.  Talk with your caregiver about your diet, exercise habits, and other lifestyle factors that may be contributing to high blood pressure.  See your caregiver regularly. Your caregiver can help you create and adjust your plan for managing high blood pressure. RECOMMENDATIONS FOR TREATMENT AND FOLLOW-UP  The following recommendations are based on current guidelines for managing high blood pressure in nonpregnant adults. Use these recommendations to identify the proper follow-up period or treatment option based on your blood pressure reading. You can discuss these options with your caregiver.  Systolic pressure of 120 to 139 or diastolic pressure of 80 to 89: Follow up with your caregiver as directed.  Systolic pressure of 140 to 160 or diastolic pressure of 90 to 100: Follow  up with your caregiver within 2 months.  Systolic pressure above 160 or diastolic pressure above 100: Follow up with your caregiver within 1 month.  Systolic pressure above 180 or diastolic pressure above 110: Consider  antihypertensive therapy; follow up with your caregiver within 1 week.  Systolic pressure above 200 or diastolic pressure above 120: Begin antihypertensive therapy; follow up with your caregiver within 1 week. Document Released: 09/04/2012 Document Reviewed: 09/04/2012 [facility] Patient Information 2014 [place], [place]. Hypertension As your heart beats, it forces blood through your arteries. This force is your blood pressure. If the pressure is too high, it is called hypertension (HTN) or high blood pressure. HTN is dangerous because you may have it and not know it. High blood pressure may mean that your heart has to work harder to pump blood. Your arteries may be narrow or stiff. The extra work puts you at risk for heart disease, stroke, and other problems.  Blood pressure consists of two numbers, a higher number over a lower, 110/72, for example. It is stated as "110 over 72." The ideal is below 120 for the top number (systolic) and under 80 for the bottom (diastolic). Write down your blood pressure today. You should pay close attention to your blood pressure if you have certain conditions such as:  Heart failure.  Prior heart attack.  Diabetes  Chronic kidney disease.  Prior stroke.  Multiple risk factors for heart disease. To see if you have HTN, your blood pressure should be measured while you are seated with your arm held at the level of the heart. It should be measured at least twice. A one-time elevated blood pressure reading (especially in the Emergency Department) does not mean that you need treatment. There may be conditions in which the blood pressure is different between your right and left arms. It is important to see your caregiver soon for a recheck. Most people have essential hypertension which means that there is not a specific cause. This type of high blood pressure may be lowered by changing lifestyle factors such as:  Stress.  Smoking.  Lack of exercise.  Excessive  weight.  Drug/tobacco/alcohol use.  Eating less salt. Most people do not have symptoms from high blood pressure until it has caused damage to the body. Effective treatment can often prevent, delay or reduce that damage. TREATMENT  When a cause has been identified, treatment for high blood pressure is directed at the cause. There are a large number of medications to treat HTN. These fall into several categories, and your caregiver will help you select the medicines that are best for you. Medications may have side effects. You should review side effects with your caregiver. If your blood pressure stays high after you have made lifestyle changes or started on medicines,   Your medication(s) may need to be changed.  Other problems may need to be addressed.  Be certain you understand your prescriptions, and know how and when to take your medicine.  Be sure to follow up with your caregiver within the time frame advised (usually within two weeks) to have your blood pressure rechecked and to review your medications.  If you are taking more than one medicine to lower your blood pressure, make sure you know how and at what times they should be taken. Taking two medicines at the same time can result in blood pressure that is too low. SEEK IMMEDIATE MEDICAL CARE IF:  You develop a severe headache, blurred or changing vision, or confusion.  You  have unusual weakness or numbness, or a faint feeling.  You have severe chest or abdominal pain, vomiting, or breathing problems. MAKE SURE YOU:   Understand these instructions.  Will watch your condition.  Will get help right away if you are not doing well or get worse. Document Released: 12/11/2005 Document Revised: 03/04/2012 Document Reviewed: 07/31/2008 [facility] Patient Information 2014 [place], [place].

## 2013-09-02 ENCOUNTER — Ambulatory Visit: Payer: No Typology Code available for payment source

## 2014-01-07 ENCOUNTER — Ambulatory Visit: Payer: Self-pay

## 2014-01-09 ENCOUNTER — Encounter: Payer: Self-pay | Admitting: Internal Medicine

## 2014-01-09 ENCOUNTER — Ambulatory Visit: Payer: Medicaid Other | Attending: Internal Medicine | Admitting: Internal Medicine

## 2014-01-09 VITALS — BP 198/144 | HR 89 | Temp 98.6°F | Resp 16 | Ht 67.0 in | Wt 168.0 lb

## 2014-01-09 DIAGNOSIS — K219 Gastro-esophageal reflux disease without esophagitis: Secondary | ICD-10-CM

## 2014-01-09 DIAGNOSIS — I10 Essential (primary) hypertension: Secondary | ICD-10-CM | POA: Insufficient documentation

## 2014-01-09 DIAGNOSIS — E785 Hyperlipidemia, unspecified: Secondary | ICD-10-CM

## 2014-01-09 MED ORDER — LISINOPRIL-HYDROCHLOROTHIAZIDE 20-25 MG PO TABS: 1.0000 | ORAL_TABLET | Freq: Every day | ORAL | Status: DC

## 2014-01-09 MED ORDER — HYDRALAZINE HCL 100 MG PO TABS: 100.0000 mg | ORAL_TABLET | Freq: Three times a day (TID) | ORAL | Status: DC

## 2014-01-09 MED ORDER — CLONIDINE HCL 0.2 MG PO TABS: ORAL_TABLET | ORAL | Status: DC

## 2014-01-09 MED ORDER — RANITIDINE HCL 150 MG PO TABS
150.0000 mg | ORAL_TABLET | Freq: Two times a day (BID) | ORAL | Status: DC
Start: 2014-01-09 — End: 2015-12-14

## 2014-01-09 MED ORDER — NICOTINE 21 MG/24HR TD PT24: 21.0000 mg | MEDICATED_PATCH | TRANSDERMAL | Status: DC

## 2014-01-09 MED ORDER — CLONIDINE HCL 0.1 MG PO TABS
0.2000 mg | ORAL_TABLET | Freq: Once | ORAL | Status: AC
  Administered 2014-01-09: 0.2 mg via ORAL

## 2014-01-09 NOTE — Patient Instructions (Signed)

## 2014-01-09 NOTE — Progress Notes (Signed)
Patient ID: Frank Warren, male   DOB: 03/18/1961, 53 y.o.   MRN: [id number]  CC: Followup  HPI: 53 year old male with past medical history of acid reflux, hypertension who presented to clinic for followup. Patient reported he did not take blood pressure medications today as he ran out of them. He hasn't been taking blood pressure medications for past 3 days. No chest pain or shortness of breath. No headaches, blurry vision or lightheadedness.  No Known Allergies Past Medical History  Diagnosis Date  . Hypertension   . Hyperlipidemia   . Acid reflux disease   . Dyslipidemia   . Tobacco abuse    No current outpatient prescriptions on file prior to visit.   No current facility-administered medications on file prior to visit.   History reviewed. No pertinent family history. History   Social History  . Marital Status: Married    Spouse Name: N/A    Number of Children: N/A  . Years of Education: N/A   Occupational History  . Not on file.   Social History Main Topics  . Smoking status: Current Every Day Smoker -- 0.50 packs/day    Types: Cigarettes  . Smokeless tobacco: Not on file  . Alcohol Use: Not on file  . Drug Use: Not on file  . Sexual Activity: Not on file   Other Topics Concern  . Not on file   Social History Narrative  . No narrative on file    Review of Systems  Constitutional: Negative for fever, chills, diaphoresis, activity change, appetite change and fatigue.  HENT: Negative for ear pain, nosebleeds, congestion, facial swelling, rhinorrhea, neck pain, neck stiffness and ear discharge.   Eyes: Negative for pain, discharge, redness, itching and visual disturbance.  Respiratory: Negative for cough, choking, chest tightness, shortness of breath, wheezing and stridor.   Cardiovascular: Negative for chest pain, palpitations and leg swelling.  Gastrointestinal: Negative for abdominal distention.  Genitourinary: Negative for dysuria, urgency, frequency, hematuria,  flank pain, decreased urine volume, difficulty urinating and dyspareunia.  Musculoskeletal: Negative for back pain, joint swelling, arthralgias and gait problem.  Neurological: Negative for dizziness, tremors, seizures, syncope, facial asymmetry, speech difficulty, weakness, light-headedness, numbness and headaches.  Hematological: Negative for adenopathy. Does not bruise/bleed easily.  Psychiatric/Behavioral: Negative for hallucinations, behavioral problems, confusion, dysphoric mood, decreased concentration and agitation.    Objective:   Filed Vitals:   01/09/14 0929  BP: 198/144  Pulse: 89  Temp: 98.6 F (37 C)  Resp: 16    Physical Exam  Constitutional: Appears well-developed and well-nourished. No distress.  HENT: Normocephalic. External right and left ear normal. Oropharynx is clear and moist.  Eyes: Conjunctivae and EOM are normal. PERRLA, no scleral icterus.  Neck: Normal ROM. Neck supple. No JVD. No tracheal deviation. No thyromegaly.  CVS: RRR, S1/S2 +, no murmurs, no gallops, no carotid bruit.  Pulmonary: Effort and breath sounds normal, no stridor, rhonchi, wheezes, rales.  Abdominal: Soft. BS +,  no distension, tenderness, rebound or guarding.  Musculoskeletal: Normal range of motion. No edema and no tenderness.  Lymphadenopathy: No lymphadenopathy noted, cervical, inguinal. Neuro: Alert. Normal reflexes, muscle tone coordination. No cranial nerve deficit. Skin: Skin is warm and dry. No rash noted. Not diaphoretic. No erythema. No pallor.  Psychiatric: Normal mood and affect. Behavior, judgment, thought content normal.   Lab Results  Component Value Date   WBC 4.9 11/18/2008   HGB 16.1 11/18/2008   HCT 47.9 11/18/2008   MCV 86.3 11/18/2008   PLT 190  11/18/2008   Lab Results  Component Value Date   CREATININE 0.98 06/25/2013   BUN 11 06/25/2013   NA 140 06/25/2013   K 3.7 06/25/2013   CL 99 06/25/2013   CO2 28 06/25/2013    Lab Results  Component Value Date    HGBA1C 5.6 06/12/2013   Lipid Panel     Component Value Date/Time   CHOL 169 06/12/2013 1021   TRIG 84 06/12/2013 1021   HDL 42 06/12/2013 1021   CHOLHDL 4.0 06/12/2013 1021   VLDL 17 06/12/2013 1021   LDLCALC 110* 06/12/2013 1021       Assessment and plan:   Patient Active Problem List   Diagnosis Date Noted  . Accelerated hypertension 06/10/2013    Priority: High - Secondary to noncompliance  - We have discussed target BP range - I have advised pt to check BP regularly and to call us back if the numbers are higher than 140/90 - discussed the importance of compliance with medical therapy and diet  - Prescriptions provided for clonidine; prescription provided for clonidine, hydralazine and lisinopril. We'll give 1 dose of clonidine 0.2 mg in the clinic   . Acid reflux disease 06/10/2013    Priority: Medium - Continue ranitidine

## 2014-01-09 NOTE — Progress Notes (Signed)
Patient ID: Frank Warren, male   DOB: 03/17/1961, 53 y.o.   MRN: [id number] After clonidine 0.2 mg, blood pressure rechecked and systolic blood pressure 217. Patient instructed to go to emergency for further evaluation and treatment. Patient declined going to ED and wanted to go home take his blood pressure medications as instructed and recheck it later on today. He was still instructed if it's anything above 190 to go to emergency room for further evaluation.  [name], [name] [phone]

## 2014-01-09 NOTE — Progress Notes (Signed)
Pt is here following up on his HTN. 

## 2014-01-10 LAB — BASIC METABOLIC PANEL
BUN: 10 mg/dL (ref 6–23)
CALCIUM: 9.7 mg/dL (ref 8.4–10.5)
CO2: 32 meq/L (ref 19–32)
Chloride: 99 mEq/L (ref 96–112)
Creat: 0.81 mg/dL (ref 0.50–1.35)
Glucose, Bld: 136 mg/dL — ABNORMAL HIGH (ref 70–99)
Potassium: 3.5 mEq/L (ref 3.5–5.3)
SODIUM: 141 meq/L (ref 135–145)

## 2014-10-12 ENCOUNTER — Encounter: Payer: Self-pay | Admitting: Internal Medicine

## 2014-10-12 ENCOUNTER — Ambulatory Visit: Payer: Medicaid Other | Attending: Internal Medicine | Admitting: Internal Medicine

## 2014-10-12 VITALS — BP 164/102 | HR 71 | Temp 98.3°F | Resp 16 | Ht 67.0 in | Wt 163.0 lb

## 2014-10-12 DIAGNOSIS — K219 Gastro-esophageal reflux disease without esophagitis: Secondary | ICD-10-CM | POA: Insufficient documentation

## 2014-10-12 DIAGNOSIS — F17210 Nicotine dependence, cigarettes, uncomplicated: Secondary | ICD-10-CM | POA: Insufficient documentation

## 2014-10-12 DIAGNOSIS — I10 Essential (primary) hypertension: Secondary | ICD-10-CM | POA: Diagnosis present

## 2014-10-12 DIAGNOSIS — Z23 Encounter for immunization: Secondary | ICD-10-CM

## 2014-10-12 MED ORDER — LISINOPRIL-HYDROCHLOROTHIAZIDE 20-25 MG PO TABS: 1.0000 | ORAL_TABLET | Freq: Every day | ORAL | Status: DC

## 2014-10-12 MED ORDER — CLONIDINE HCL 0.1 MG PO TABS: 0.1000 mg | ORAL_TABLET | Freq: Three times a day (TID) | ORAL | Status: DC

## 2014-10-12 MED ORDER — HYDRALAZINE HCL 25 MG PO TABS
25.0000 mg | ORAL_TABLET | Freq: Three times a day (TID) | ORAL | Status: DC
Start: 2014-10-12 — End: 2014-11-03

## 2014-10-12 MED ORDER — CLONIDINE HCL 0.1 MG PO TABS
0.2000 mg | ORAL_TABLET | Freq: Once | ORAL | Status: AC
  Administered 2014-10-12: 0.2 mg via ORAL

## 2014-10-12 NOTE — Progress Notes (Signed)
Patient ID: Frank Warren, male   DOB: 10/04/1961, 53 y.o.   MRN: [id number]  CC: hypertension follow up   HPI:  Patient presents to clinic today for a follow up of hypertension.  Patient has not been evaluated here in 9 months.  He reports that he takes his medication daily without skipped doses.  Today he reports that he feels like his blood pressure is elevated.  He report that he stopped taking hydralazine in January because of the side effects.  He reports that when he took the hydralazine he began to feel dizzy, sweaty, numbness of face, and "heavy/slow tongue". He denies any swelling of the face or SOB with medication use.  He reports that he last took his BP medication last night.  He has been taking 0.1 clonidine twice daily and lisinopril-HCTZ once daily.    No Known Allergies Past Medical History  Diagnosis Date  . Hypertension   . Hyperlipidemia   . Acid reflux disease   . Dyslipidemia   . Tobacco abuse    Current Outpatient Prescriptions on File Prior to Visit  Medication Sig Dispense Refill  . cloNIDine (CATAPRES) 0.2 MG tablet Take half tablet twice daily  60 tablet  5  . hydrALAZINE (APRESOLINE) 100 MG tablet Take 1 tablet (100 mg total) by mouth 3 (three) times daily.  90 tablet  5  . lisinopril-hydrochlorothiazide (PRINZIDE,ZESTORETIC) 20-25 MG per tablet Take 1 tablet by mouth daily.  30 tablet  5  . nicotine (EQL NICOTINE) 21 mg/24hr patch Place 1 patch (21 mg total) onto the skin daily.  28 patch  5  . ranitidine (ZANTAC) 150 MG tablet Take 1 tablet (150 mg total) by mouth 2 (two) times daily.  60 tablet  5   No current facility-administered medications on file prior to visit.   History reviewed. No pertinent family history. History   Social History  . Marital Status: Married    Spouse Name: N/A    Number of Children: N/A  . Years of Education: N/A   Occupational History  . Not on file.   Social History Main Topics  . Smoking status: Current Every Day Smoker --  0.50 packs/day    Types: Cigarettes  . Smokeless tobacco: Not on file  . Alcohol Use: Not on file  . Drug Use: Not on file  . Sexual Activity: Not on file   Other Topics Concern  . Not on file   Social History Narrative  . No narrative on file    Review of Systems: Constitutional: Negative for fever, chills, diaphoresis, activity change, appetite change and fatigue. HENT: Negative for ear pain, nosebleeds, congestion, facial swelling, rhinorrhea, neck pain, neck stiffness and ear discharge.  Eyes: Negative for pain, discharge, redness, itching and visual disturbance. Respiratory: Negative for cough, choking, chest tightness, shortness of breath, wheezing and stridor.  Cardiovascular: Negative for chest pain, palpitations and leg swelling. Gastrointestinal: Negative for abdominal distention. Genitourinary: Negative for dysuria, urgency, frequency, hematuria, flank pain, decreased urine volume, difficulty urinating and dyspareunia.  Musculoskeletal: Negative for back pain, joint swelling, arthralgias and gait problem. Neurological: Negative for dizziness, tremors, seizures, syncope, facial asymmetry, speech difficulty, weakness, light-headedness, numbness and headaches.  Hematological: Negative for adenopathy. Does not bruise/bleed easily. Psychiatric/Behavioral: Negative for hallucinations, behavioral problems, confusion, dysphoric mood, decreased concentration and agitation.    Objective:   Filed Vitals:   10/12/14 1156  BP: 187/116  Pulse: 71  Temp: 98.3 F (36.8 C)  Resp: 16  Physical Exam  Constitutional: He is oriented to person, place, and time. No distress.  Cardiovascular: Normal rate, regular rhythm and normal heart sounds.   Pulmonary/Chest: Effort normal and breath sounds normal.  Abdominal: Soft. Bowel sounds are normal.  Musculoskeletal: He exhibits no edema and no tenderness.  Neurological: He is alert and oriented to person, place, and time. He has normal  reflexes.  Skin: Skin is warm and dry. He is not diaphoretic.     Lab Results  Component Value Date   WBC 4.9 11/18/2008   HGB 16.1 11/18/2008   HCT 47.9 11/18/2008   MCV 86.3 11/18/2008   PLT 190 11/18/2008   Lab Results  Component Value Date   CREATININE 0.81 01/09/2014   BUN 10 01/09/2014   NA 141 01/09/2014   K 3.5 01/09/2014   CL 99 01/09/2014   CO2 32 01/09/2014    Lab Results  Component Value Date   HGBA1C 5.6 06/12/2013   Lipid Panel     Component Value Date/Time   CHOL 169 06/12/2013 1021   TRIG 84 06/12/2013 1021   HDL 42 06/12/2013 1021   CHOLHDL 4.0 06/12/2013 1021   VLDL 17 06/12/2013 1021   LDLCALC 110* 06/12/2013 1021       Assessment and plan:   Dyshawn was seen today for follow-up.  Diagnoses and associated orders for this visit:  Accelerated hypertension - cloNIDine (CATAPRES) tablet 0.2 mg; Take 2 tablets (0.2 mg total) by mouth once. - Change to hydrALAZINE (APRESOLINE) 25 MG tablet; Take 1 tablet (25 mg total) by mouth 3 (three) times daily. - Continue lisinopril-hydrochlorothiazide (PRINZIDE,ZESTORETIC) 20-25 MG per tablet; Take 1 tablet by mouth daily. - Changed to 3 times a day from twice. cloNIDine (CATAPRES) 0.1 MG tablet; Take 1 tablet (0.1 mg total) by mouth 3 (three) times daily.  Encounter for immunization Flu shot received   Return in about 1 week (around 10/19/2014) for Nurse Visit-BP check and 3 mo PCP.  Nurse will find out how patient has tolerated hydralazine use       Holland CommonsKECK, VALERIE, NP-C [facility] [phone] 10/12/2014, 12:13 PM

## 2014-10-12 NOTE — Patient Instructions (Signed)
Smoking Cessation Quitting smoking is important to your health and has many advantages. However, it is not always easy to quit since nicotine is a very addictive drug. Oftentimes, people try 3 times or more before being able to quit. This document explains the best ways for you to prepare to quit smoking. Quitting takes hard work and a lot of effort, but you can do it. ADVANTAGES OF QUITTING SMOKING  You will live longer, feel better, and live better.  Your body will feel the impact of quitting smoking almost immediately.  Within 20 minutes, blood pressure decreases. Your pulse returns to its normal level.  After 8 hours, carbon monoxide levels in the blood return to normal. Your oxygen level increases.  After 24 hours, the chance of having a heart attack starts to decrease. Your breath, hair, and body stop smelling like smoke.  After 48 hours, damaged nerve endings begin to recover. Your sense of taste and smell improve.  After 72 hours, the body is virtually free of nicotine. Your bronchial tubes relax and breathing becomes easier.  After 2 to 12 weeks, lungs can hold more air. Exercise becomes easier and circulation improves.  The risk of having a heart attack, stroke, cancer, or lung disease is greatly reduced.  After 1 year, the risk of coronary heart disease is cut in half.  After 5 years, the risk of stroke falls to the same as a nonsmoker.  After 10 years, the risk of lung cancer is cut in half and the risk of other cancers decreases significantly.  After 15 years, the risk of coronary heart disease drops, usually to the level of a nonsmoker.  If you are pregnant, quitting smoking will improve your chances of having a healthy baby.  The people you live with, especially any children, will be healthier.  You will have extra money to spend on things other than cigarettes. QUESTIONS TO THINK ABOUT BEFORE ATTEMPTING TO QUIT You may want to talk about your answers with your  health care provider.  Why do you want to quit?  If you tried to quit in the past, what helped and what did not?  What will be the most difficult situations for you after you quit? How will you plan to handle them?  Who can help you through the tough times? Your family? Friends? A health care provider?  What pleasures do you get from smoking? What ways can you still get pleasure if you quit? Here are some questions to ask your health care provider:  How can you help me to be successful at quitting?  What medicine do you think would be best for me and how should I take it?  What should I do if I need more help?  What is smoking withdrawal like? How can I get information on withdrawal? GET READY  Set a quit date.  Change your environment by getting rid of all cigarettes, ashtrays, matches, and lighters in your home, car, or work. Do not let people smoke in your home.  Review your past attempts to quit. Think about what worked and what did not. GET SUPPORT AND ENCOURAGEMENT You have a better chance of being successful if you have help. You can get support in many ways.  Tell your family, friends, and coworkers that you are going to quit and need their support. Ask them not to smoke around you.  Get individual, group, or telephone counseling and support. Programs are available at local hospitals and health centers. Call   your local health department for information about programs in your area.  Spiritual beliefs and practices may help some smokers quit.  Download a "quit meter" on your computer to keep track of quit statistics, such as how long you have gone without smoking, cigarettes not smoked, and money saved.  Get a self-help book about quitting smoking and staying off tobacco. LEARN NEW SKILLS AND BEHAVIORS  Distract yourself from urges to smoke. Talk to someone, go for a walk, or occupy your time with a task.  Change your normal routine. Take a different route to work.  Drink tea instead of coffee. Eat breakfast in a different place.  Reduce your stress. Take a hot bath, exercise, or read a book.  Plan something enjoyable to do every day. Reward yourself for not smoking.  Explore interactive web-based programs that specialize in helping you quit. GET MEDICINE AND USE IT CORRECTLY Medicines can help you stop smoking and decrease the urge to smoke. Combining medicine with the above behavioral methods and support can greatly increase your chances of successfully quitting smoking.  Nicotine replacement therapy helps deliver nicotine to your body without the negative effects and risks of smoking. Nicotine replacement therapy includes nicotine gum, lozenges, inhalers, nasal sprays, and skin patches. Some may be available over-the-counter and others require a prescription.  Antidepressant medicine helps people abstain from smoking, but how this works is unknown. This medicine is available by prescription.  Nicotinic receptor partial agonist medicine simulates the effect of nicotine in your brain. This medicine is available by prescription. Ask your health care provider for advice about which medicines to use and how to use them based on your health history. Your health care provider will tell you what side effects to look out for if you choose to be on a medicine or therapy. Carefully read the information on the package. Do not use any other product containing nicotine while using a nicotine replacement product.  RELAPSE OR DIFFICULT SITUATIONS Most relapses occur within the first 3 months after quitting. Do not be discouraged if you start smoking again. Remember, most people try several times before finally quitting. You may have symptoms of withdrawal because your body is used to nicotine. You may crave cigarettes, be irritable, feel very hungry, cough often, get headaches, or have difficulty concentrating. The withdrawal symptoms are only temporary. They are strongest  when you first quit, but they will go away within 10-14 days. To reduce the chances of relapse, try to:  Avoid drinking alcohol. Drinking lowers your chances of successfully quitting.  Reduce the amount of caffeine you consume. Once you quit smoking, the amount of caffeine in your body increases and can give you symptoms, such as a rapid heartbeat, sweating, and anxiety.  Avoid smokers because they can make you want to smoke.  Do not let weight gain distract you. Many smokers will gain weight when they quit, usually less than 10 pounds. Eat a healthy diet and stay active. You can always lose the weight gained after you quit.  Find ways to improve your mood other than smoking. FOR MORE INFORMATION  www.smokefree.gov  Document Released: 12/05/2001 Document Revised: 04/27/2014 Document Reviewed: 03/21/2012 [employer] Patient Information 2015 [employer]. This information is not intended to replace advice given to you by your health care provider. Make sure you discuss any questions you have with your health care provider.  

## 2014-10-12 NOTE — Progress Notes (Signed)
Pt is here following up on his hyperlipidemia and HTN. Pt has no C.C. today

## 2014-10-20 ENCOUNTER — Ambulatory Visit: Payer: Medicaid Other | Attending: Internal Medicine | Admitting: *Deleted

## 2014-10-20 VITALS — BP 152/96 | HR 52 | Temp 97.9°F | Wt 164.2 lb

## 2014-10-20 DIAGNOSIS — I10 Essential (primary) hypertension: Secondary | ICD-10-CM | POA: Insufficient documentation

## 2014-10-20 NOTE — Patient Instructions (Signed)
Increase hydralazine to 50 mg three times daily. Continue clonidine 0.1 mg three times a day Continue lisinopril/HCTZ 20/25 mg 1 tab daily      DASH Eating Plan DASH stands for "Dietary Approaches to Stop Hypertension." The DASH eating plan is a healthy eating plan that has been shown to reduce high blood pressure (hypertension). Additional health benefits may include reducing the risk of type 2 diabetes mellitus, heart disease, and stroke. The DASH eating plan may also help with weight loss. WHAT DO I NEED TO KNOW ABOUT THE DASH EATING PLAN? For the DASH eating plan, you will follow these general guidelines:  Choose foods with a percent daily value for sodium of less than 5% (as listed on the food label).  Use salt-free seasonings or herbs instead of table salt or sea salt.  Check with your health care provider or pharmacist before using salt substitutes.  Eat lower-sodium products, often labeled as "lower sodium" or "no salt added."  Eat fresh foods.  Eat more vegetables, fruits, and low-fat dairy products.  Choose whole grains. Look for the word "whole" as the first word in the ingredient list.  Choose fish and skinless chicken or [place] more often than red meat. Limit fish, poultry, and meat to 6 oz (170 g) each day.  Limit sweets, desserts, sugars, and sugary drinks.  Choose heart-healthy fats.  Limit cheese to 1 oz (28 g) per day.  Eat more home-cooked food and less restaurant, buffet, and fast food.  Limit fried foods.  Cook foods using methods other than frying.  Limit canned vegetables. If you do use them, rinse them well to decrease the sodium.  When eating at a restaurant, ask that your food be prepared with less salt, or no salt if possible. WHAT FOODS CAN I EAT? Seek help from a dietitian for individual calorie needs. Grains Whole grain or whole wheat bread. Brown rice. Whole grain or whole wheat pasta. Quinoa, bulgur, and whole grain cereals. Low-sodium  cereals. Corn or whole wheat flour tortillas. Whole grain cornbread. Whole grain crackers. Low-sodium crackers. Vegetables Fresh or frozen vegetables (raw, steamed, roasted, or grilled). Low-sodium or reduced-sodium tomato and vegetable juices. Low-sodium or reduced-sodium tomato sauce and paste. Low-sodium or reduced-sodium canned vegetables.  Fruits All fresh, canned (in natural juice), or frozen fruits. Meat and Other Protein Products Ground beef (85% or leaner), grass-fed beef, or beef trimmed of fat. Skinless chicken or [place]key. Ground chicken or [place]key. Pork trimmed of fat. All fish and seafood. Eggs. Dried beans, peas, or lentils. Unsalted nuts and seeds. Unsalted canned beans. Dairy Low-fat dairy products, such as skim or 1% milk, 2% or reduced-fat cheeses, low-fat ricotta or cottage cheese, or plain low-fat yogurt. Low-sodium or reduced-sodium cheeses. Fats and Oils Tub margarines without trans fats. Light or reduced-fat mayonnaise and salad dressings (reduced sodium). Avocado. Safflower, olive, or canola oils. Natural peanut or almond butter. Other Unsalted popcorn and pretzels. The items listed above may not be a complete list of recommended foods or beverages. Contact your dietitian for more options. WHAT FOODS ARE NOT RECOMMENDED? Grains White bread. White pasta. White rice. Refined cornbread. Bagels and croissants. Crackers that contain trans fat. Vegetables Creamed or fried vegetables. Vegetables in a cheese sauce. Regular canned vegetables. Regular canned tomato sauce and paste. Regular tomato and vegetable juices. Fruits Dried fruits. Canned fruit in light or heavy syrup. Fruit juice. Meat and Other Protein Products Fatty cuts of meat. Ribs, chicken wings, bacon, sausage, bologna, salami, chitterlings, fatback, hot dogs,  bratwurst, and packaged luncheon meats. Salted nuts and seeds. Canned beans with salt. Dairy Whole or 2% milk, cream, half-and-half, and cream cheese.  Whole-fat or sweetened yogurt. Full-fat cheeses or blue cheese. Nondairy creamers and whipped toppings. Processed cheese, cheese spreads, or cheese curds. Condiments Onion and garlic salt, seasoned salt, table salt, and sea salt. Canned and packaged gravies. Worcestershire sauce. Tartar sauce. Barbecue sauce. Teriyaki sauce. Soy sauce, including reduced sodium. Steak sauce. Fish sauce. Oyster sauce. Cocktail sauce. Horseradish. Ketchup and mustard. Meat flavorings and tenderizers. Bouillon cubes. Hot sauce. Tabasco sauce. Marinades. Taco seasonings. Relishes. Fats and Oils Butter, stick margarine, lard, shortening, ghee, and bacon fat. Coconut, palm kernel, or palm oils. Regular salad dressings. Other Pickles and olives. Salted popcorn and pretzels. The items listed above may not be a complete list of foods and beverages to avoid. Contact your dietitian for more information. WHERE CAN I FIND MORE INFORMATION? National Heart, Lung, and Blood Institute: CableProm[employer].itwww.nhlbi.nih.gov/health/health-topics/topics/dash/ Document Released: 11/30/2011 Document Revised: 04/27/2014 Document Reviewed: 10/15/2013 [facility] Patient Information 2015 [place] [place]. This information is not intended to replace advice given to you by your health care provider. Make sure you discuss any questions you have with your health care provider.

## 2014-10-20 NOTE — Progress Notes (Signed)
Patient ID: Frank Warren, male   DOB: 09/02/1961, 53 y.o.   MRN: [id number]  Patient presents for BP check States taking all meds as directed  BP 152/96 left arm manually P 52 SPO2 100%  States he woke This AM with headache; rates 5-6/10 at present Denies visual changes Denies chest pain or pressure  Per PCP, patient instructed to increase hydralazine to 25 mg tid and continue clonidine and prinizide as previously directed  Patient instructed on low sodium diet and information given on DASH Eating Plan  Patient will return in 2 weeks for nurse visit/BP check

## 2014-11-03 ENCOUNTER — Ambulatory Visit: Payer: Medicaid Other | Attending: Internal Medicine | Admitting: *Deleted

## 2014-11-03 VITALS — BP 118/71 | HR 73 | Temp 97.9°F

## 2014-11-03 DIAGNOSIS — I10 Essential (primary) hypertension: Secondary | ICD-10-CM | POA: Diagnosis present

## 2014-11-03 MED ORDER — HYDRALAZINE HCL 50 MG PO TABS: 50.0000 mg | ORAL_TABLET | Freq: Three times a day (TID) | ORAL | Status: DC

## 2014-11-03 NOTE — Progress Notes (Signed)
Patient presents for BP check Reports feeling well Denies headache, chest pain and blurred vision States he has reduced sodium in his diet  States taking BP meds as follows:  Clonidine 0.1 mg one half tab tid. States this is due to low heart rate with whole tab Hydralazine 25 mg 2 tabs tid  Lisinopril/HCTZ 20-25 1 tab daily  Requesting refill on hydralazine  BP 118/71 P 73 T 97.9 oral SPO2 98%  Patient is very pleased. States his blood pressure has not been this good in 10 years.  Per PCP patient is to continue taking all BP meds as he has been. Hydralazine 50 mg 1 tab po tid #90 with [facility]

## 2015-01-22 ENCOUNTER — Ambulatory Visit: Payer: Medicaid Other | Attending: Internal Medicine | Admitting: Internal Medicine

## 2015-01-22 ENCOUNTER — Encounter: Payer: Self-pay | Admitting: Internal Medicine

## 2015-01-22 VITALS — BP 151/102 | HR 73 | Temp 98.4°F | Resp 20 | Ht 67.0 in | Wt 167.0 lb

## 2015-01-22 DIAGNOSIS — I10 Essential (primary) hypertension: Secondary | ICD-10-CM | POA: Diagnosis not present

## 2015-01-22 DIAGNOSIS — F17210 Nicotine dependence, cigarettes, uncomplicated: Secondary | ICD-10-CM | POA: Diagnosis not present

## 2015-01-22 DIAGNOSIS — Z79899 Other long term (current) drug therapy: Secondary | ICD-10-CM | POA: Diagnosis not present

## 2015-01-22 MED ORDER — LISINOPRIL-HYDROCHLOROTHIAZIDE 20-25 MG PO TABS: 1.0000 | ORAL_TABLET | Freq: Every day | ORAL | Status: DC

## 2015-01-22 MED ORDER — CLONIDINE HCL 0.1 MG PO TABS: 0.1000 mg | ORAL_TABLET | Freq: Three times a day (TID) | ORAL | Status: DC

## 2015-01-22 MED ORDER — HYDRALAZINE HCL 50 MG PO TABS: 50.0000 mg | ORAL_TABLET | Freq: Three times a day (TID) | ORAL | Status: DC

## 2015-01-22 NOTE — Progress Notes (Signed)
Patient here for hypertension follow-up. Patient taking all medication as prescribed Denies headaches, dizziness, chest pain BP 151/99 in right arm, 151/102 in left arm

## 2015-01-22 NOTE — Progress Notes (Signed)
Patient ID: Frank Warren, male   DOB: 12/21/1961, 54 y.o.   MRN: [id number]  Subjective:  Frank Warren is a 54 y.o. male with hypertension.  Current Outpatient Prescriptions  Medication Sig Dispense Refill  . cloNIDine (CATAPRES) 0.1 MG tablet Take 1 tablet (0.1 mg total) by mouth 3 (three) times daily. 90 tablet 3  . hydrALAZINE (APRESOLINE) 50 MG tablet Take 1 tablet (50 mg total) by mouth 3 (three) times daily. 90 tablet 3  . lisinopril-hydrochlorothiazide (PRINZIDE,ZESTORETIC) 20-25 MG per tablet Take 1 tablet by mouth daily. 30 tablet 3  . ranitidine (ZANTAC) 150 MG tablet Take 1 tablet (150 mg total) by mouth 2 (two) times daily. 60 tablet 5  . nicotine (EQL NICOTINE) 21 mg/24hr patch Place 1 patch (21 mg total) onto the skin daily. (Patient not taking: Reported on 01/22/2015) 28 patch 5   No current facility-administered medications for this visit.    Hypertension ROS: taking medications as instructed, no medication side effects noted, patient does not perform home BP monitoring, no TIA's, no chest pain on exertion, no dyspnea on exertion, no swelling of ankles and no palpitations.  New concerns: He reports that he forgets to take the afternoon dose of his BP medication often. He took his first morning dose of BP medication right before coming to this visit.    Objective:  BP 151/102 mmHg  Pulse 73  Temp(Src) 98.4 F (36.9 C) (Oral)  Resp 20  Ht 5\' 7"  (1.702 m)  Wt 167 lb (75.751 kg)  BMI 26.15 kg/m2  SpO2 99%  Appearance alert, well appearing, and in no distress and oriented to person, place, and time. General exam BP noted to be mildly elevated today in office, S1, S2 normal, no gallop, no murmur, chest clear, no JVD, no HSM, no edema, CVS exam  - normal rate, regular rhythm, normal S1, S2, no murmurs, rubs, clicks or gallops, normal bilateral carotid upstroke without bruits, no JVD.  Lab review: labs are reviewed, up to date and normal.   Assessment:   Hypertension control  uncertain, patient poorly compliant, needs to quit smoking and needs to follow diet more regularly. I have stressed medication compliance and serious effects of untreated hypertension.   Plan:  Reviewed diet, exercise and weight control. Very strongly urged to quit smoking to reduce cardiovascular risk. Follow up: 3 month and as needed.Marland Kitchen.  Holland CommonsKECK, VALERIE, NP 01/22/2015 12:48 PM

## 2015-02-25 ENCOUNTER — Other Ambulatory Visit: Payer: Self-pay | Admitting: Internal Medicine

## 2015-05-27 ENCOUNTER — Emergency Department (HOSPITAL_COMMUNITY): Payer: Medicaid Other

## 2015-05-27 ENCOUNTER — Emergency Department (HOSPITAL_COMMUNITY)
Admission: EM | Admit: 2015-05-27 | Discharge: 2015-05-27 | Disposition: A | Discharge status: Home / Self Care | Payer: Medicaid Other | Attending: Emergency Medicine | Admitting: Emergency Medicine

## 2015-05-27 ENCOUNTER — Encounter (HOSPITAL_COMMUNITY): Payer: Self-pay | Admitting: Emergency Medicine

## 2015-05-27 DIAGNOSIS — M25519 Pain in unspecified shoulder: Secondary | ICD-10-CM | POA: Diagnosis not present

## 2015-05-27 DIAGNOSIS — K219 Gastro-esophageal reflux disease without esophagitis: Secondary | ICD-10-CM | POA: Insufficient documentation

## 2015-05-27 DIAGNOSIS — Z720 Tobacco use: Secondary | ICD-10-CM | POA: Insufficient documentation

## 2015-05-27 DIAGNOSIS — R0789 Other chest pain: Secondary | ICD-10-CM | POA: Diagnosis not present

## 2015-05-27 DIAGNOSIS — R079 Chest pain, unspecified: Secondary | ICD-10-CM | POA: Diagnosis present

## 2015-05-27 DIAGNOSIS — Z8639 Personal history of other endocrine, nutritional and metabolic disease: Secondary | ICD-10-CM | POA: Insufficient documentation

## 2015-05-27 DIAGNOSIS — I10 Essential (primary) hypertension: Secondary | ICD-10-CM | POA: Insufficient documentation

## 2015-05-27 DIAGNOSIS — Z79899 Other long term (current) drug therapy: Secondary | ICD-10-CM | POA: Insufficient documentation

## 2015-05-27 LAB — I-STAT TROPONIN, ED
Troponin i, poc: 0 ng/mL (ref 0.00–0.08)
Troponin i, poc: 0.01 ng/mL (ref 0.00–0.08)

## 2015-05-27 LAB — BASIC METABOLIC PANEL
Anion gap: 7 (ref 5–15)
BUN: 9 mg/dL (ref 6–20)
CALCIUM: 9.1 mg/dL (ref 8.9–10.3)
CO2: 28 mmol/L (ref 22–32)
Chloride: 104 mmol/L (ref 101–111)
Creatinine, Ser: 0.98 mg/dL (ref 0.61–1.24)
GFR calc Af Amer: >60 mL/min (ref >60)
GLUCOSE: 116 mg/dL — AB (ref 65–99)
POTASSIUM: 3.4 mmol/L — AB (ref 3.5–5.1)
Sodium: 139 mmol/L (ref 135–145)

## 2015-05-27 LAB — CBC
HCT: 47.4 % (ref 39.0–52.0)
Hemoglobin: 16.4 g/dL (ref 13.0–17.0)
MCH: 28.9 pg (ref 26.0–34.0)
MCHC: 34.6 g/dL (ref 30.0–36.0)
MCV: 83.5 fL (ref 78.0–100.0)
PLATELETS: 164 10*3/uL (ref 150–400)
RBC: 5.68 MIL/uL (ref 4.22–5.81)
RDW: 13.6 % (ref 11.5–15.5)
WBC: 6 10*3/uL (ref 4.0–10.5)

## 2015-05-27 MED ORDER — IBUPROFEN 800 MG PO TABS: 800.0000 mg | ORAL_TABLET | Freq: Three times a day (TID) | ORAL | Status: DC | PRN

## 2015-05-27 NOTE — ED Notes (Signed)
Chest pain started this am, left sided, hurts with resp, states has not taken BP meds today

## 2015-05-27 NOTE — ED Notes (Signed)
Pt c/o left sided chest pain exacerbated by deep breathing. Pt NAD at present. VSS on cardiac, monitor.

## 2015-05-27 NOTE — ED Notes (Signed)
Iv attempted x1 unsucessful. Blood draw successful.  Will attempt again at a later time if patient requires an IV for further testing or admission.

## 2015-05-27 NOTE — Discharge Instructions (Signed)
Return here as needed.  Follow-up with your primary care doctor.  Your testing here today does not show any sense of heart related issues, however follow-up is important

## 2015-05-27 NOTE — ED Provider Notes (Signed)
CSN: [id number]     Arrival date & time 05/27/15  1032 History   First MD Initiated Contact with Patient 05/27/15 1347     Chief Complaint  Patient presents with  . Chest Pain     (Consider location/radiation/quality/duration/timing/severity/associated sxs/prior Treatment) HPI Patient presents to the emergency department with chest pain that started when he woke up this morning that has been constant since that time.  The patient states he does have some pain by his shoulder blade on the back.  The patient states that it is made worse by coughing, deep breathing, movements and laughing.  The patient states that nothing seems to make his condition better, has pain waxes and wanes but never completely goes away, but gets worse with those activities mentioned.  Patient denies shortness of breath, nausea, vomiting, weakness, dizziness, headache, blurred vision, back pain, neck pain, cough, fever, runny nose, sore throat, rash, or syncope Past Medical History  Diagnosis Date  . Hypertension   . Hyperlipidemia   . Acid reflux disease   . Dyslipidemia   . Tobacco abuse    History reviewed. No pertinent past surgical history. No family history on file. History  Substance Use Topics  . Smoking status: Current Every Day Smoker -- 0.50 packs/day    Types: Cigarettes  . Smokeless tobacco: Not on file     Comment: Smoking 6 cigs/day  . Alcohol Use: No    Review of Systems  All other systems negative except as documented in the HPI. All pertinent positives and negatives as reviewed in the HPI.  Allergies  Review of patient's allergies indicates no known allergies.  Home Medications   Prior to Admission medications   Medication Sig Start Date End Date Taking? Authorizing Provider  cloNIDine (CATAPRES) 0.1 MG tablet Take 1 tablet (0.1 mg total) by mouth 3 (three) times daily. 01/22/15   Ambrose FinlandValerie A Keck, NP  hydrALAZINE (APRESOLINE) 50 MG tablet Take 1 tablet (50 mg total) by mouth 3 (three)  times daily. 01/22/15   Ambrose FinlandValerie A Keck, NP  lisinopril-hydrochlorothiazide (PRINZIDE,ZESTORETIC) 20-25 MG per tablet Take 1 tablet by mouth daily. 01/22/15   Ambrose FinlandValerie A Keck, NP  nicotine (EQL NICOTINE) 21 mg/24hr patch Place 1 patch (21 mg total) onto the skin daily. Patient not taking: Reported on 01/22/2015 01/09/14   Alison MurrayAlma M Devine, MD  ranitidine (ZANTAC) 150 MG tablet Take 1 tablet (150 mg total) by mouth 2 (two) times daily. 01/09/14   Alison MurrayAlma M Devine, MD   BP 168/93 mmHg  Pulse 50  Temp(Src) 98.1 F (36.7 C) (Oral)  Resp 18  SpO2 98% Physical Exam  Constitutional: He is oriented to person, place, and time. He appears well-developed and well-nourished. No distress.  HENT:  Head: Normocephalic and atraumatic.  Mouth/Throat: Oropharynx is clear and moist.  Eyes: Pupils are equal, round, and reactive to light.  Neck: Normal range of motion. Neck supple.  Cardiovascular: Normal rate, regular rhythm and normal heart sounds.  Exam reveals no gallop and no friction rub.   No murmur heard. Pulmonary/Chest: Breath sounds normal. No respiratory distress.  Abdominal: Soft. Bowel sounds are normal. He exhibits no distension. There is no tenderness.  Musculoskeletal: He exhibits no edema.  Neurological: He is alert and oriented to person, place, and time. He exhibits normal muscle tone. Coordination normal.  Skin: Skin is warm and dry. No rash noted. No erythema.  Psychiatric: He has a normal mood and affect. His behavior is normal.  Nursing note and vitals reviewed.  ED Course  Procedures (including critical care time) Labs Review Labs Reviewed  BASIC METABOLIC PANEL - Abnormal; Notable for the following:    Potassium 3.4 (*)    Glucose, Bld 116 (*)    All other components within normal limits  CBC  I-STAT TROPOININ, ED  Rosezena Sensor, ED    Imaging Review Dg Chest 2 View  05/27/2015   CLINICAL DATA:  Chest pain for 1 day, history of tobacco use  EXAM: CHEST - 2 VIEW  COMPARISON:   None.  FINDINGS: The heart size and mediastinal contours are within normal limits. Both lungs are clear. The visualized skeletal structures are unremarkable. A radiopaque lead is noted over the right upper chest.  IMPRESSION: No active disease.   Electronically Signed   By: Alcide Clever M.D.   On: 05/27/2015 11:49     Patient is most likely having chest wall discomfort.  The patient is PERC negative and low risk based on well's criteria.  The patient is advised of the plan and all questions were answered.  He has had 2 sets of negative troponins.  He will be advised follow-up with his primary care Dr. for recheck.  Told to return here as needed   Charlestine Night, PA-C 05/27/15 1548  Gerhard Munch, MD 05/27/15 [phone]

## 2015-12-13 ENCOUNTER — Telehealth: Payer: Self-pay | Admitting: Internal Medicine

## 2015-12-13 ENCOUNTER — Telehealth: Payer: Self-pay

## 2015-12-13 NOTE — Telephone Encounter (Signed)
cloNIDine (CATAPRES) 0.1 MG tablet hydrALAZINE (APRESOLINE) 50 MG tablet lisinopril-hydrochlorothiazide (PRINZIDE,ZESTORETIC) 20-25 MG per tablet  Pt. Needs refills. Pharmacy said they could not fill prescriptions

## 2015-12-13 NOTE — Telephone Encounter (Signed)
Returned patient phone call Patient not available Left message on voice mail to return our call Patient will need to schedule an appointment for refills Patient has not been seen in almost a year

## 2015-12-14 ENCOUNTER — Encounter: Payer: Self-pay | Admitting: Family Medicine

## 2015-12-14 ENCOUNTER — Ambulatory Visit: Payer: Medicaid Other | Attending: Family Medicine | Admitting: Family Medicine

## 2015-12-14 ENCOUNTER — Telehealth: Payer: Self-pay

## 2015-12-14 VITALS — BP 200/100 | HR 65 | Temp 98.2°F | Resp 16 | Ht 67.0 in | Wt 174.0 lb

## 2015-12-14 DIAGNOSIS — I10 Essential (primary) hypertension: Secondary | ICD-10-CM | POA: Insufficient documentation

## 2015-12-14 DIAGNOSIS — Z720 Tobacco use: Secondary | ICD-10-CM | POA: Insufficient documentation

## 2015-12-14 DIAGNOSIS — K219 Gastro-esophageal reflux disease without esophagitis: Secondary | ICD-10-CM | POA: Diagnosis not present

## 2015-12-14 DIAGNOSIS — Z79899 Other long term (current) drug therapy: Secondary | ICD-10-CM | POA: Insufficient documentation

## 2015-12-14 MED ORDER — CLONIDINE HCL 0.1 MG PO TABS: 0.1000 mg | ORAL_TABLET | Freq: Once | ORAL | Status: DC

## 2015-12-14 MED ORDER — LISINOPRIL-HYDROCHLOROTHIAZIDE 20-25 MG PO TABS: 1.0000 | ORAL_TABLET | Freq: Every day | ORAL | Status: DC

## 2015-12-14 MED ORDER — HYDRALAZINE HCL 50 MG PO TABS: 50.0000 mg | ORAL_TABLET | Freq: Three times a day (TID) | ORAL | Status: DC

## 2015-12-14 MED ORDER — RANITIDINE HCL 150 MG PO TABS: 150.0000 mg | ORAL_TABLET | Freq: Two times a day (BID) | ORAL | Status: DC

## 2015-12-14 MED ORDER — CLONIDINE HCL 0.1 MG PO TABS: 0.1000 mg | ORAL_TABLET | Freq: Three times a day (TID) | ORAL | Status: DC

## 2015-12-14 NOTE — Progress Notes (Signed)
Subjective:  Patient ID: Frank Warren, male    DOB: March 03, 1961  Age: 54 y.o. MRN: [id number]  CC: Medication Refill   HPI Frank Warren is a 54 year old male with history of uncontrolled hypertension, gastroesophageal reflux disease who comes into the clinic requesting a refill of his medications which he has been out for some time. Blood pressures is in the accelerated range and he denies headaches or chest pains or shortness of breath. He continues to smoke and is not adherent with the low-sodium diet or exercise.  He does have gastroesophageal reflux disease that has been compliant with his ranitidine.  Outpatient Prescriptions Prior to Visit  Medication Sig Dispense Refill  . cloNIDine (CATAPRES) 0.1 MG tablet Take 1 tablet (0.1 mg total) by mouth 3 (three) times daily. 90 tablet 4  . hydrALAZINE (APRESOLINE) 50 MG tablet Take 1 tablet (50 mg total) by mouth 3 (three) times daily. 90 tablet 4  . ibuprofen (ADVIL,MOTRIN) 800 MG tablet Take 1 tablet (800 mg total) by mouth every 8 (eight) hours as needed. 21 tablet 0  . lisinopril-hydrochlorothiazide (PRINZIDE,ZESTORETIC) 20-25 MG per tablet Take 1 tablet by mouth daily. 30 tablet 4  . nicotine (EQL NICOTINE) 21 mg/24hr patch Place 1 patch (21 mg total) onto the skin daily. (Patient not taking: Reported on 01/22/2015) 28 patch 5  . ranitidine (ZANTAC) 150 MG tablet Take 1 tablet (150 mg total) by mouth 2 (two) times daily. (Patient not taking: Reported on 12/14/2015) 60 tablet 5   No facility-administered medications prior to visit.    ROS Review of Systems  Constitutional: Negative for activity change and appetite change.  HENT: Negative for sinus pressure and sore throat.   Eyes: Negative for visual disturbance.  Respiratory: Negative for cough, chest tightness and shortness of breath.   Cardiovascular: Negative for chest pain and leg swelling.  Gastrointestinal: Negative for abdominal pain, diarrhea, constipation and abdominal  distention.  Endocrine: Negative.   Genitourinary: Negative for dysuria.  Musculoskeletal: Negative for myalgias and joint swelling.  Skin: Negative for rash.  Allergic/Immunologic: Negative.   Neurological: Negative for weakness, light-headedness and numbness.  Psychiatric/Behavioral: Negative for suicidal ideas and dysphoric mood.    Objective:  BP 226/112 mmHg  Pulse 65  Temp(Src) 98.2 F (36.8 C) (Oral)  Resp 16  Ht  (1.702 m)  Wt 174 lb (78.926 kg)  BMI 27.25 kg/m2  SpO2 100%  BP/Weight 12/14/2015 05/27/2015 01/22/2015  Systolic BP 226 168 151  Diastolic BP 112 93 102  Wt. (Lbs) 174 - 167  BMI 27.25 - 26.15      Physical Exam  Constitutional: He is oriented to person, place, and time. He appears well-developed and well-nourished.  Cardiovascular: Normal rate, normal heart sounds and intact distal pulses.   No murmur heard. Pulmonary/Chest: Effort normal and breath sounds normal. He has no wheezes. He has no rales. He exhibits no tenderness.  Abdominal: Soft. Bowel sounds are normal. He exhibits no distension and no mass. There is no tenderness.  Musculoskeletal: Normal range of motion.  Neurological: He is alert and oriented to person, place, and time.     Assessment & Plan:   1. Accelerated hypertension Clonidine 0.1 mg given and blood pressure reassessed after 30 minutes - lisinopril-hydrochlorothiazide (PRINZIDE,ZESTORETIC) 20-25 MG tablet; Take 1 tablet by mouth daily.  Dispense: 30 tablet; Refill: 4 - hydrALAZINE (APRESOLINE) 50 MG tablet; Take 1 tablet (50 mg total) by mouth 3 (three) times daily.  Dispense: 90 tablet; Refill: 4 -  cloNIDine (CATAPRES) 0.1 MG tablet; Take 1 tablet (0.1 mg total) by mouth 3 (three) times daily.  Dispense: 90 tablet; Refill: 4  2. Gastroesophageal reflux disease without esophagitis Controlled - ranitidine (ZANTAC) 150 MG tablet; Take 1 tablet (150 mg total) by mouth 2 (two) times daily.  Dispense: 60 tablet; Refill:  5  3. Tobacco abuse disorder Spent 3 minutes counseling on cessation and he is not ready to quit at this time   Meds ordered this encounter  Medications  . ranitidine (ZANTAC) 150 MG tablet    Sig: Take 1 tablet (150 mg total) by mouth 2 (two) times daily.    Dispense:  60 tablet    Refill:  5  . lisinopril-hydrochlorothiazide (PRINZIDE,ZESTORETIC) 20-25 MG tablet    Sig: Take 1 tablet by mouth daily.    Dispense:  30 tablet    Refill:  4  . hydrALAZINE (APRESOLINE) 50 MG tablet    Sig: Take 1 tablet (50 mg total) by mouth 3 (three) times daily.    Dispense:  90 tablet    Refill:  4  . cloNIDine (CATAPRES) 0.1 MG tablet    Sig: Take 1 tablet (0.1 mg total) by mouth 3 (three) times daily.    Dispense:  90 tablet    Refill:  4    Follow-up: Return in about 3 weeks (around 01/04/2016) for Follow-up of hypertension with PCP.Marland Kitchen.   Jaclyn ShaggyEnobong Amao MD

## 2015-12-14 NOTE — Telephone Encounter (Signed)
Pt. Called requesting a med refill on the following medications:  cloNIDine (CATAPRES) 0.1 MG tablet  hydrALAZINE (APRESOLINE) 50 MG tablet lisinopril-hydrochlorothiazide (PRINZIDE,ZESTORETIC) 20-25 MG per tablet  Please f/u with pt.

## 2015-12-14 NOTE — Telephone Encounter (Signed)
Returned patient phone patient not available Message left on voice mail to return our call Patient will need an appointment to be seen Last appointment was about a year ago

## 2015-12-14 NOTE — Progress Notes (Signed)
Patients here for medication refill.  Patient requsting refill Clonidine, Lisinopril, Hydralazine.  Patient states he's been out of meds for several months now.  Patient denies feeling dizzy.

## 2015-12-14 NOTE — Patient Instructions (Signed)
Hypertension Hypertension, commonly called high blood pressure, is when the force of blood pumping through your arteries is too strong. Your arteries are the blood vessels that carry blood from your heart throughout your body. A blood pressure reading consists of a higher number over a lower number, such as 110/72. The higher number (systolic) is the pressure inside your arteries when your heart pumps. The lower number (diastolic) is the pressure inside your arteries when your heart relaxes. Ideally you want your blood pressure below 120/80. Hypertension forces your heart to work harder to pump blood. Your arteries may become narrow or stiff. Having untreated or uncontrolled hypertension can cause heart attack, stroke, kidney disease, and other problems. RISK FACTORS Some risk factors for high blood pressure are controllable. Others are not.  Risk factors you cannot control include:   Race. You may be at higher risk if you are African American.  Age. Risk increases with age.  Gender. Men are at higher risk than women before age 45 years. After age 65, women are at higher risk than men. Risk factors you can control include:  Not getting enough exercise or physical activity.  Being overweight.  Getting too much fat, sugar, calories, or salt in your diet.  Drinking too much alcohol. SIGNS AND SYMPTOMS Hypertension does not usually cause signs or symptoms. Extremely high blood pressure (hypertensive crisis) may cause headache, anxiety, shortness of breath, and nosebleed. DIAGNOSIS To check if you have hypertension, your health care provider will measure your blood pressure while you are seated, with your arm held at the level of your heart. It should be measured at least twice using the same arm. Certain conditions can cause a difference in blood pressure between your right and left arms. A blood pressure reading that is higher than normal on one occasion does not mean that you need treatment. If  it is not clear whether you have high blood pressure, you may be asked to return on a different day to have your blood pressure checked again. Or, you may be asked to monitor your blood pressure at home for 1 or more weeks. TREATMENT Treating high blood pressure includes making lifestyle changes and possibly taking medicine. Living a healthy lifestyle can help lower high blood pressure. You may need to change some of your habits. Lifestyle changes may include:  Following the DASH diet. This diet is high in fruits, vegetables, and whole grains. It is low in salt, red meat, and added sugars.  Keep your sodium intake below 2,300 mg per day.  Getting at least 30-45 minutes of aerobic exercise at least 4 times per week.  Losing weight if necessary.  Not smoking.  Limiting alcoholic beverages.  Learning ways to reduce stress. Your health care provider may prescribe medicine if lifestyle changes are not enough to get your blood pressure under control, and if one of the following is true:  You are 18-59 years of age and your systolic blood pressure is above 140.  You are 60 years of age or older, and your systolic blood pressure is above 150.  Your diastolic blood pressure is above 90.  You have diabetes, and your systolic blood pressure is over 140 or your diastolic blood pressure is over 90.  You have kidney disease and your blood pressure is above 140/90.  You have heart disease and your blood pressure is above 140/90. Your personal target blood pressure may vary depending on your medical conditions, your age, and other factors. HOME CARE INSTRUCTIONS    Have your blood pressure rechecked as directed by your health care provider.   Take medicines only as directed by your health care provider. Follow the directions carefully. Blood pressure medicines must be taken as prescribed. The medicine does not work as well when you skip doses. Skipping doses also puts you at risk for  problems.  Do not smoke.   Monitor your blood pressure at home as directed by your health care provider. SEEK MEDICAL CARE IF:   You think you are having a reaction to medicines taken.  You have recurrent headaches or feel dizzy.  You have swelling in your ankles.  You have trouble with your vision. SEEK IMMEDIATE MEDICAL CARE IF:  You develop a severe headache or confusion.  You have unusual weakness, numbness, or feel faint.  You have severe chest or abdominal pain.  You vomit repeatedly.  You have trouble breathing. MAKE SURE YOU:   Understand these instructions.  Will watch your condition.  Will get help right away if you are not doing well or get worse.   This information is not intended to replace advice given to you by your health care provider. Make sure you discuss any questions you have with your health care provider.   Document Released: 12/11/2005 Document Revised: 04/27/2015 Document Reviewed: 10/03/2013 Elsevier Interactive Patient Education 2016 Elsevier Inc.  

## 2015-12-15 ENCOUNTER — Other Ambulatory Visit: Payer: Self-pay | Admitting: Family Medicine

## 2015-12-15 DIAGNOSIS — K219 Gastro-esophageal reflux disease without esophagitis: Secondary | ICD-10-CM

## 2015-12-15 MED ORDER — RANITIDINE HCL 150 MG PO TABS: 150.0000 mg | ORAL_TABLET | Freq: Two times a day (BID) | ORAL | Status: DC

## 2015-12-29 MED FILL — raNITIdine HCL 150 MG TABS: 150 | 30 days supply | Qty: 60 | Fill #0

## 2016-01-07 ENCOUNTER — Encounter: Payer: Self-pay | Admitting: Internal Medicine

## 2016-01-07 ENCOUNTER — Ambulatory Visit: Payer: Medicaid Other | Attending: Internal Medicine | Admitting: Internal Medicine

## 2016-01-07 VITALS — BP 132/84 | HR 71 | Temp 98.5°F | Resp 16 | Ht 67.0 in | Wt 172.0 lb

## 2016-01-07 DIAGNOSIS — Z720 Tobacco use: Secondary | ICD-10-CM

## 2016-01-07 DIAGNOSIS — Z79899 Other long term (current) drug therapy: Secondary | ICD-10-CM | POA: Insufficient documentation

## 2016-01-07 DIAGNOSIS — Z6826 Body mass index (BMI) 26.0-26.9, adult: Secondary | ICD-10-CM | POA: Insufficient documentation

## 2016-01-07 DIAGNOSIS — K219 Gastro-esophageal reflux disease without esophagitis: Secondary | ICD-10-CM | POA: Insufficient documentation

## 2016-01-07 DIAGNOSIS — I10 Essential (primary) hypertension: Secondary | ICD-10-CM | POA: Diagnosis not present

## 2016-01-07 LAB — BASIC METABOLIC PANEL
BUN: 8 mg/dL (ref 7–25)
CALCIUM: 9.4 mg/dL (ref 8.6–10.3)
CHLORIDE: 98 mmol/L (ref 98–110)
CO2: 33 mmol/L — ABNORMAL HIGH (ref 20–31)
Creat: 0.87 mg/dL (ref 0.70–1.33)
Glucose, Bld: 99 mg/dL (ref 65–99)
POTASSIUM: 3.2 mmol/L — AB (ref 3.5–5.3)
SODIUM: 138 mmol/L (ref 135–146)

## 2016-01-07 NOTE — Progress Notes (Signed)
Patient ID: Frank Warren, male   DOB: 03/04/1961, 55 y.o.   MRN: [id number]   Subjective:  Frank Mirebu B Esses is a 55 year old male with history of uncontrolled hypertension, gastroesophageal reflux disease who comes into the clinic for a blood pressure follow up. He was seen by Dr. Venetia NightAmao last month and was given refills of his medications. At that time his pressure was severely elevated at 226/112. Today his pressure is 182/118 but states that he took all 3 of his blood pressure pills right when he got here today. He does admit to skipping the 3rd dose of medication daily because he forgets. Blood pressures is in the accelerated range and he denies headaches or chest pains or shortness of breath. He continues to smoke and is not adherent with the low-sodium diet or exercise even after counseling last month with different provider.  Current Outpatient Prescriptions  Medication Sig Dispense Refill  . cloNIDine (CATAPRES) 0.1 MG tablet Take 1 tablet (0.1 mg total) by mouth 3 (three) times daily. 90 tablet 4  . hydrALAZINE (APRESOLINE) 50 MG tablet Take 1 tablet (50 mg total) by mouth 3 (three) times daily. 90 tablet 4  . lisinopril-hydrochlorothiazide (PRINZIDE,ZESTORETIC) 20-25 MG tablet Take 1 tablet by mouth daily. 30 tablet 4  . nicotine (EQL NICOTINE) 21 mg/24hr patch Place 1 patch (21 mg total) onto the skin daily. (Patient not taking: Reported on 01/22/2015) 28 patch 5  . ranitidine (ZANTAC) 150 MG tablet Take 1 tablet (150 mg total) by mouth 2 (two) times daily. 60 tablet 2   Current Facility-Administered Medications  Medication Dose Route Frequency Provider Last Rate Last Dose  . cloNIDine (CATAPRES) tablet 0.1 mg  0.1 mg Oral Once Jaclyn ShaggyEnobong Amao, MD        ROS: Other than what is stated in HPI, all other systems are negative.   Objective:  BP 182/118 mmHg  Pulse 62  Temp(Src) 98.5 F (36.9 C)  Resp 16  Ht 5\' 7"  (1.702 m)  Wt 172 lb (78.019 kg)  BMI 26.93 kg/m2  SpO2 100%  Appearance alert,  well appearing, and in no distress, oriented to person, place, and time and normal appearing weight. General exam BP noted to be severely elevated today in office, S1, S2 normal, no gallop, no murmur, chest clear, no JVD, no HSM, no edema.  Lab review: orders written for new lab studies as appropriate; see orders.   Assessment:   Timothy was seen today for follow-up.  Diagnoses and all orders for this visit:  Accelerated hypertension -     Basic Metabolic Panel I have helped patient to develop a plan on how to remember taking his medications such as setting his phone alarm at 8 am, 2pm, and 8pm. I have also suggested that he gets a pill box that has morning, afternnon, and evening dosing so that he can remember if he has taken all 3 dose per day. Patient will RTC in 1 week for a BP check with Stacy. I have stressed the long term complications in uncontrolled hypertension including stroke, MI, CKD, etc.  BP stable before discharged at 132/84, after allowing time for home medications to work.   Tobacco abuse disorder Smoking cessation discussed for 3 minutes, patient is not willing to quit at this time. Will continue to assess on each visit. Discussed increased risk for diseases such as cancer, heart disease, and stroke.    Plan:  Reviewed diet, exercise and weight control. Recommended sodium restriction. Very strongly urged to  quit smoking to reduce cardiovascular risk. Cardiovascular risk and specific lipid/LDL goals reviewed..    Return in about 1 week (around 01/14/2016) for Nurse Visit-bp check and 3 mo PCP HTN.   Ambrose Finland, NP 01/07/2016 2:56 PM

## 2016-01-07 NOTE — Progress Notes (Signed)
Patient here for follow up on his HTN Patient presents in office with elevated blood pressure But stated only took his medication about fifteen minutes ago

## 2016-01-10 ENCOUNTER — Other Ambulatory Visit: Payer: Self-pay | Admitting: Internal Medicine

## 2016-01-10 ENCOUNTER — Telehealth: Payer: Self-pay

## 2016-01-10 DIAGNOSIS — E876 Hypokalemia: Secondary | ICD-10-CM

## 2016-01-10 MED ORDER — POTASSIUM CHLORIDE ER 10 MEQ PO TBCR: 10.0000 meq | EXTENDED_RELEASE_TABLET | Freq: Every day | ORAL | Status: DC

## 2016-01-10 MED FILL — POTASSIUM CL 10 MEQ TAB SA: 10 | 5 days supply | Qty: 5 | Fill #0

## 2016-01-10 NOTE — Telephone Encounter (Signed)
Spoke with patient and he is aware of his lab results and to pick up his RX For potassium

## 2016-01-10 NOTE — Telephone Encounter (Signed)
-----   Message from Ambrose FinlandValerie A Keck, NP sent at 01/10/2016  1:50 PM EST ----- Potassium is low. I will send 5 days of potassium replacements. Explain importance of potassium

## 2016-01-13 ENCOUNTER — Ambulatory Visit: Payer: Medicaid Other | Attending: Internal Medicine | Admitting: Pharmacist

## 2016-01-13 VITALS — BP 151/91 | HR 54

## 2016-01-13 DIAGNOSIS — I10 Essential (primary) hypertension: Secondary | ICD-10-CM | POA: Diagnosis not present

## 2016-01-13 DIAGNOSIS — Z79899 Other long term (current) drug therapy: Secondary | ICD-10-CM | POA: Insufficient documentation

## 2016-01-13 MED FILL — hydrALAZINE HCL 50 MG TABS: 50 | 30 days supply | Qty: 90 | Fill #1

## 2016-01-13 MED FILL — LISINOPRIL-HCTZ 20-25 MG TA: 20-25 | 30 days supply | Qty: 30 | Fill #1

## 2016-01-13 MED FILL — cloNIDine HCL 0.1 MG TABS: 0.1 | 30 days supply | Qty: 90 | Fill #1

## 2016-01-13 NOTE — Patient Instructions (Addendum)
Thank you for coming to see me!  We still make need to increase one of your medications to get your blood pressure <140/90 but we can try cutting out salt first.  Remember there is a lot of salt in prepackaged foods and fast foods ([employer], [employer]i Slicker ,etc).  DASH Eating Plan DASH stands for "Dietary Approaches to Stop Hypertension." The DASH eating plan is a healthy eating plan that has been shown to reduce high blood pressure (hypertension). Additional health benefits may include reducing the risk of type 2 diabetes mellitus, heart disease, and stroke. The DASH eating plan may also help with weight loss. WHAT DO I NEED TO KNOW ABOUT THE DASH EATING PLAN? For the DASH eating plan, you will follow these general guidelines:  Choose foods with a percent daily value for sodium of less than 5% (as listed on the food label).  Use salt-free seasonings or herbs instead of table salt or sea salt.  Check with your health care provider or pharmacist before using salt substitutes.  Eat lower-sodium products, often labeled as "lower sodium" or "no salt added."  Eat fresh foods.  Eat more vegetables, fruits, and low-fat dairy products.  Choose whole grains. Look for the word "whole" as the first word in the ingredient list.  Choose fish and skinless chicken or [place] more often than red meat. Limit fish, poultry, and meat to 6 oz (170 g) each day.  Limit sweets, desserts, sugars, and sugary drinks.  Choose heart-healthy fats.  Limit cheese to 1 oz (28 g) per day.  Eat more home-cooked food and less restaurant, buffet, and fast food.  Limit fried foods.  Cook foods using methods other than frying.  Limit canned vegetables. If you do use them, rinse them well to decrease the sodium.  When eating at a restaurant, ask that your food be prepared with less salt, or no salt if possible. WHAT FOODS CAN I EAT? Seek help from a dietitian for individual calorie needs. Grains Whole grain or  whole wheat bread. Brown rice. Whole grain or whole wheat pasta. Quinoa, bulgur, and whole grain cereals. Low-sodium cereals. Corn or whole wheat flour tortillas. Whole grain cornbread. Whole grain crackers. Low-sodium crackers. Vegetables Fresh or frozen vegetables (raw, steamed, roasted, or grilled). Low-sodium or reduced-sodium tomato and vegetable juices. Low-sodium or reduced-sodium tomato sauce and paste. Low-sodium or reduced-sodium canned vegetables.  Fruits All fresh, canned (in natural juice), or frozen fruits. Meat and Other Protein Products Ground beef (85% or leaner), grass-fed beef, or beef trimmed of fat. Skinless chicken or [place]. Ground chicken or [place]. Pork trimmed of fat. All fish and seafood. Eggs. Dried beans, peas, or lentils. Unsalted nuts and seeds. Unsalted canned beans. Dairy Low-fat dairy products, such as skim or 1% milk, 2% or reduced-fat cheeses, low-fat ricotta or cottage cheese, or plain low-fat yogurt. Low-sodium or reduced-sodium cheeses. Fats and Oils Tub margarines without trans fats. Light or reduced-fat mayonnaise and salad dressings (reduced sodium). Avocado. Safflower, olive, or canola oils. Natural peanut or almond butter. Other Unsalted popcorn and pretzels. The items listed above may not be a complete list of recommended foods or beverages. Contact your dietitian for more options. WHAT FOODS ARE NOT RECOMMENDED? Grains White bread. White pasta. White rice. Refined cornbread. Bagels and croissants. Crackers that contain trans fat. Vegetables Creamed or fried vegetables. Vegetables in a cheese sauce. Regular canned vegetables. Regular canned tomato sauce and paste. Regular tomato and vegetable juices. Fruits Dried fruits. Canned fruit in light or heavy  syrup. Fruit juice. Meat and Other Protein Products Fatty cuts of meat. Ribs, chicken wings, bacon, sausage, bologna, salami, chitterlings, fatback, hot dogs, bratwurst, and packaged luncheon meats.  Salted nuts and seeds. Canned beans with salt. Dairy Whole or 2% milk, cream, half-and-half, and cream cheese. Whole-fat or sweetened yogurt. Full-fat cheeses or blue cheese. Nondairy creamers and whipped toppings. Processed cheese, cheese spreads, or cheese curds. Condiments Onion and garlic salt, seasoned salt, table salt, and sea salt. Canned and packaged gravies. Worcestershire sauce. Tartar sauce. Barbecue sauce. Teriyaki sauce. Soy sauce, including reduced sodium. Steak sauce. Fish sauce. Oyster sauce. Cocktail sauce. Horseradish. Ketchup and mustard. Meat flavorings and tenderizers. Bouillon cubes. Hot sauce. Tabasco sauce. Marinades. Taco seasonings. Relishes. Fats and Oils Butter, stick margarine, lard, shortening, ghee, and bacon fat. Coconut, palm kernel, or palm oils. Regular salad dressings. Other Pickles and olives. Salted popcorn and pretzels. The items listed above may not be a complete list of foods and beverages to avoid. Contact your dietitian for more information. WHERE CAN I FIND MORE INFORMATION? National Heart, Lung, and Blood Institute: [employer]   This information is not intended to replace advice given to you by your health care provider. Make sure you discuss any questions you have with your health care provider.   Document Released: 11/30/2011 Document Revised: 01/01/2015 Document Reviewed: 10/15/2013 Elsevier Interactive Patient Education [employer]

## 2016-01-13 NOTE — Progress Notes (Signed)
S:    Patient arrives in good spirits. Presents to the clinic for hypertension evaluation.   Patient reports adherence with medications. He reports doing a better job of remembering to take his medications. He reports that he has taken his medication today.  Current BP Medications include:  Lisinopril-HCTZ 20-25 mg daily, clonidine 0.1 mg TID, and hydralazine 50 mg TID.  He denies making any efforts to cut out salt from his diet.     O:   Last 3 Office BP readings: BP Readings from Last 3 Encounters:  01/13/16 151/91  01/07/16 132/84  12/14/15 200/100    BMET    Component Value Date/Time   NA 138 01/07/2016 1451   K 3.2* 01/07/2016 1451   CL 98 01/07/2016 1451   CO2 33* 01/07/2016 1451   GLUCOSE 99 01/07/2016 1451   BUN 8 01/07/2016 1451   CREATININE 0.87 01/07/2016 1451   CREATININE 0.98 05/27/2015 1105   CALCIUM 9.4 01/07/2016 1451   GFRNONAA >60 05/27/2015 1105   GFRAA >60 05/27/2015 1105    A/P: History of hypertension currently UNcontrolled on current medications.  Continue clonidine 0.1 mg TID (cannot increase as patient already has bradycardia), hydralazine 10 mg TID, and lisinopril-HCTZ 20-25 mg daily. Patient wants to focus now on cutting out salt so that he does not have to increase his medication doses. We reviewed the DASH diet and I informed patient that if his blood pressure does not get to goal with dietary changes, that we will have to increase his blood pressure medication doses to get him to goal. Patient verbalized understanding.   Results reviewed and written information provided on DASH diet.   Total time in face-to-face counseling 20 minutes.  F/U Clinic Visit with me in 2-4 weeks for a blood pressure check.

## 2016-02-11 MED FILL — raNITIdine HCL 150 MG TABS: 150 | 30 days supply | Qty: 60 | Fill #1

## 2016-02-11 MED FILL — hydrALAZINE HCL 50 MG TABS: 50 | 30 days supply | Qty: 90 | Fill #2

## 2016-02-11 MED FILL — cloNIDine HCL 0.1 MG TABS: 0.1 | 30 days supply | Qty: 90 | Fill #2

## 2016-02-11 MED FILL — LISINOPRIL-HCTZ 20-25 MG TA: 20-25 | 30 days supply | Qty: 30 | Fill #2

## 2016-03-17 MED FILL — cloNIDine HCL 0.1 MG TABS: 0.1 | 30 days supply | Qty: 90 | Fill #3

## 2016-03-17 MED FILL — LISINOPRIL-HCTZ 20-25 MG TA: 20-25 | 30 days supply | Qty: 30 | Fill #3

## 2016-03-17 MED FILL — raNITIdine HCL 150 MG TABS: 150 | 30 days supply | Qty: 60 | Fill #2

## 2016-04-20 ENCOUNTER — Other Ambulatory Visit: Payer: Self-pay | Admitting: Family Medicine

## 2016-04-20 MED FILL — LISINOPRIL-HCTZ 20-25 MG TA: 20-25 | 30 days supply | Qty: 30 | Fill #4

## 2016-04-20 MED FILL — hydrALAZINE HCL 50 MG TABS: 50 | 30 days supply | Qty: 90 | Fill #3

## 2016-04-20 MED FILL — raNITIdine HCL 150 MG TABS: 150 | 30 days supply | Qty: 60 | Fill #0

## 2016-04-20 MED FILL — cloNIDine HCL 0.1 MG TABS: 0.1 | 30 days supply | Qty: 90 | Fill #4

## 2016-05-29 ENCOUNTER — Other Ambulatory Visit: Payer: Self-pay | Admitting: Family Medicine

## 2016-05-29 MED FILL — LISINOPRIL-HCTZ 20-25 MG TA: 20-25 | 30 days supply | Qty: 30 | Fill #0

## 2016-05-29 MED FILL — ?CLONIDINE HCL 0.1 MG TABL: 0.1 | 30 days supply | Qty: 90 | Fill #0

## 2016-05-29 MED FILL — raNITIdine HCL 150 MG TABS: 150 | 30 days supply | Qty: 60 | Fill #0

## 2016-07-14 ENCOUNTER — Other Ambulatory Visit: Payer: Self-pay | Admitting: Internal Medicine

## 2016-07-14 MED FILL — hydrALAZINE HCL 50 MG TABS: 50 | 30 days supply | Qty: 90 | Fill #4

## 2016-07-14 NOTE — Telephone Encounter (Signed)
Refill request

## 2016-07-20 ENCOUNTER — Other Ambulatory Visit: Payer: Self-pay | Admitting: Internal Medicine

## 2016-07-27 MED FILL — ?CLONIDINE HCL 0.1 MG TABL: 0.1 | 30 days supply | Qty: 90 | Fill #0

## 2016-07-27 MED FILL — LISINOPRIL-HCTZ 20-25 MG TA: 20-25 | 30 days supply | Qty: 30 | Fill #0

## 2016-07-27 MED FILL — raNITIdine HCL 150 MG TABS: 150 | 30 days supply | Qty: 60 | Fill #0

## 2016-08-04 ENCOUNTER — Ambulatory Visit: Payer: Medicaid Other

## 2016-09-18 ENCOUNTER — Ambulatory Visit: Payer: Medicaid Other | Attending: Family Medicine | Admitting: Family Medicine

## 2016-09-18 ENCOUNTER — Encounter: Payer: Self-pay | Admitting: Family Medicine

## 2016-09-18 DIAGNOSIS — E785 Hyperlipidemia, unspecified: Secondary | ICD-10-CM | POA: Insufficient documentation

## 2016-09-18 DIAGNOSIS — I10 Essential (primary) hypertension: Secondary | ICD-10-CM | POA: Insufficient documentation

## 2016-09-18 DIAGNOSIS — Z23 Encounter for immunization: Secondary | ICD-10-CM

## 2016-09-18 DIAGNOSIS — Z87891 Personal history of nicotine dependence: Secondary | ICD-10-CM | POA: Insufficient documentation

## 2016-09-18 DIAGNOSIS — K219 Gastro-esophageal reflux disease without esophagitis: Secondary | ICD-10-CM | POA: Insufficient documentation

## 2016-09-18 MED ORDER — RANITIDINE HCL 150 MG PO TABS: 150.0000 mg | ORAL_TABLET | Freq: Two times a day (BID) | ORAL | 5 refills | Status: DC

## 2016-09-18 MED ORDER — CLONIDINE HCL 0.1 MG PO TABS
0.1000 mg | ORAL_TABLET | Freq: Three times a day (TID) | ORAL | 5 refills | Status: DC
Start: 2016-09-18 — End: 2017-08-20

## 2016-09-18 MED ORDER — LISINOPRIL-HYDROCHLOROTHIAZIDE 20-25 MG PO TABS
1.0000 | ORAL_TABLET | Freq: Every day | ORAL | 5 refills | Status: DC
Start: 2016-09-18 — End: 2017-05-10

## 2016-09-18 MED ORDER — HYDRALAZINE HCL 50 MG PO TABS: 50.0000 mg | ORAL_TABLET | Freq: Three times a day (TID) | ORAL | 5 refills | Status: DC

## 2016-09-18 MED FILL — cloNIDine HCL 0.1 MG TABS: 0.1 | 30 days supply | Qty: 90 | Fill #0

## 2016-09-18 MED FILL — ?HYDRALAZINE 50 MG TABLET: 50 | 30 days supply | Qty: 90 | Fill #0

## 2016-09-18 MED FILL — raNITIdine HCL 150 MG TABS: 150 | 30 days supply | Qty: 60 | Fill #0

## 2016-09-18 MED FILL — LISINOPRIL-HCTZ 20-25 MG TA: 20-25 | 30 days supply | Qty: 30 | Fill #0

## 2016-09-18 NOTE — Patient Instructions (Signed)
Hypertension Hypertension, commonly called high blood pressure, is when the force of blood pumping through your arteries is too strong. Your arteries are the blood vessels that carry blood from your heart throughout your body. A blood pressure reading consists of a higher number over a lower number, such as 110/72. The higher number (systolic) is the pressure inside your arteries when your heart pumps. The lower number (diastolic) is the pressure inside your arteries when your heart relaxes. Ideally you want your blood pressure below 120/80. Hypertension forces your heart to work harder to pump blood. Your arteries may become narrow or stiff. Having untreated or uncontrolled hypertension can cause heart attack, stroke, kidney disease, and other problems. RISK FACTORS Some risk factors for high blood pressure are controllable. Others are not.  Risk factors you cannot control include:   Race. You may be at higher risk if you are African American.  Age. Risk increases with age.  Gender. Men are at higher risk than women before age 45 years. After age 65, women are at higher risk than men. Risk factors you can control include:  Not getting enough exercise or physical activity.  Being overweight.  Getting too much fat, sugar, calories, or salt in your diet.  Drinking too much alcohol. SIGNS AND SYMPTOMS Hypertension does not usually cause signs or symptoms. Extremely high blood pressure (hypertensive crisis) may cause headache, anxiety, shortness of breath, and nosebleed. DIAGNOSIS To check if you have hypertension, your health care provider will measure your blood pressure while you are seated, with your arm held at the level of your heart. It should be measured at least twice using the same arm. Certain conditions can cause a difference in blood pressure between your right and left arms. A blood pressure reading that is higher than normal on one occasion does not mean that you need treatment. If  it is not clear whether you have high blood pressure, you may be asked to return on a different day to have your blood pressure checked again. Or, you may be asked to monitor your blood pressure at home for 1 or more weeks. TREATMENT Treating high blood pressure includes making lifestyle changes and possibly taking medicine. Living a healthy lifestyle can help lower high blood pressure. You may need to change some of your habits. Lifestyle changes may include:  Following the DASH diet. This diet is high in fruits, vegetables, and whole grains. It is low in salt, red meat, and added sugars.  Keep your sodium intake below 2,300 mg per day.  Getting at least 30-45 minutes of aerobic exercise at least 4 times per week.  Losing weight if necessary.  Not smoking.  Limiting alcoholic beverages.  Learning ways to reduce stress. Your health care provider may prescribe medicine if lifestyle changes are not enough to get your blood pressure under control, and if one of the following is true:  You are 18-59 years of age and your systolic blood pressure is above 140.  You are 60 years of age or older, and your systolic blood pressure is above 150.  Your diastolic blood pressure is above 90.  You have diabetes, and your systolic blood pressure is over 140 or your diastolic blood pressure is over 90.  You have kidney disease and your blood pressure is above 140/90.  You have heart disease and your blood pressure is above 140/90. Your personal target blood pressure may vary depending on your medical conditions, your age, and other factors. HOME CARE INSTRUCTIONS    Have your blood pressure rechecked as directed by your health care provider.   Take medicines only as directed by your health care provider. Follow the directions carefully. Blood pressure medicines must be taken as prescribed. The medicine does not work as well when you skip doses. Skipping doses also puts you at risk for  problems.  Do not smoke.   Monitor your blood pressure at home as directed by your health care provider. SEEK MEDICAL CARE IF:   You think you are having a reaction to medicines taken.  You have recurrent headaches or feel dizzy.  You have swelling in your ankles.  You have trouble with your vision. SEEK IMMEDIATE MEDICAL CARE IF:  You develop a severe headache or confusion.  You have unusual weakness, numbness, or feel faint.  You have severe chest or abdominal pain.  You vomit repeatedly.  You have trouble breathing. MAKE SURE YOU:   Understand these instructions.  Will watch your condition.  Will get help right away if you are not doing well or get worse.   This information is not intended to replace advice given to you by your health care provider. Make sure you discuss any questions you have with your health care provider.   Document Released: 12/11/2005 Document Revised: 04/27/2015 Document Reviewed: 10/03/2013 Elsevier Interactive Patient Education 2016 Elsevier Inc.  

## 2016-09-18 NOTE — Progress Notes (Signed)
Medication refills- has been out of lisinopril-HCTZ for more then 3 weeks

## 2016-09-19 NOTE — Progress Notes (Signed)
Subjective:  Patient ID: Frank Warren, male    DOB: 04/18/1961  Age: 55 y.o. MRN: [id number]  CC: Hypertension   HPI Frank Warren presents for a follow up visit. Medical history is significant for Hypertension. He endorses being out of his anti hypertensives for the last 3 weeks and is requesting refills. Denies chest pain, shortness of breath or GI symptoms. He has no other concerns today.  Past Medical History:  Diagnosis Date  . Acid reflux disease   . Dyslipidemia   . Hyperlipidemia   . Hypertension   . Tobacco abuse     History reviewed. No pertinent surgical history.  No Known Allergies  Outpatient Medications Prior to Visit  Medication Sig Dispense Refill  . cloNIDine (CATAPRES) 0.1 MG tablet Take 1 tablet (0.1 mg total) by mouth 3 (three) times daily. 90 tablet 0  . hydrALAZINE (APRESOLINE) 50 MG tablet Take 1 tablet (50 mg total) by mouth 3 (three) times daily. 90 tablet 4  . ranitidine (ZANTAC) 150 MG tablet Take 1 tablet (150 mg total) by mouth 2 (two) times daily. 60 tablet 0  . nicotine (EQL NICOTINE) 21 mg/24hr patch Place 1 patch (21 mg total) onto the skin daily. (Patient not taking: Reported on 09/18/2016) 28 patch 5  . lisinopril-hydrochlorothiazide (PRINZIDE,ZESTORETIC) 20-25 MG tablet Take 1 tablet by mouth daily. (Patient not taking: Reported on 09/18/2016) 30 tablet 0  . potassium chloride (K-DUR) 10 MEQ tablet Take 1 tablet (10 mEq total) by mouth daily. (Patient not taking: Reported on 09/18/2016) 5 tablet 0   No facility-administered medications prior to visit.     ROS Review of Systems  Constitutional: Negative for activity change and appetite change.  HENT: Negative for sinus pressure and sore throat.   Eyes: Negative for visual disturbance.  Respiratory: Negative for cough, chest tightness and shortness of breath.   Cardiovascular: Negative for chest pain and leg swelling.  Gastrointestinal: Negative for abdominal distention, abdominal pain,  constipation and diarrhea.  Endocrine: Negative.   Genitourinary: Negative for dysuria.  Musculoskeletal: Negative for joint swelling and myalgias.  Skin: Negative for rash.  Allergic/Immunologic: Negative.   Neurological: Negative for weakness, light-headedness and numbness.  Psychiatric/Behavioral: Negative for dysphoric mood and suicidal ideas.    Objective:  BP (!) 159/96 (BP Location: Right Arm, Patient Position: Sitting, Cuff Size: Large)   Pulse 83   Temp 98.2 F (36.8 C) (Oral)   Ht 5\' 10"  (1.778 m)   Wt 170 lb 3.2 oz (77.2 kg)   SpO2 98%   BMI 24.42 kg/m   BP/Weight 09/18/2016 01/13/2016 01/07/2016  Systolic BP 159 151 132  Diastolic BP 96 91 84  Wt. (Lbs) 170.2 - 172  BMI 24.42 - 26.93      Physical Exam  Constitutional: He is oriented to person, place, and time. He appears well-developed and well-nourished.  Cardiovascular: Normal rate, normal heart sounds and intact distal pulses.   No murmur heard. Pulmonary/Chest: Effort normal and breath sounds normal. He has no wheezes. He has no rales. He exhibits no tenderness.  Abdominal: Soft. Bowel sounds are normal. He exhibits no distension and no mass. There is no tenderness.  Musculoskeletal: Normal range of motion.  Neurological: He is alert and oriented to person, place, and time.    Assessment & Plan:   1. Essential hypertension Uncontrolled due to running oput of medications - COMPLETE METABOLIC PANEL WITH GFR; Future - Lipid panel; Future - lisinopril-hydrochlorothiazide (PRINZIDE,ZESTORETIC) 20-25 MG tablet; Take 1 tablet  by mouth daily.  Dispense: 30 tablet; Refill: 5 - hydrALAZINE (APRESOLINE) 50 MG tablet; Take 1 tablet (50 mg total) by mouth 3 (three) times daily.  Dispense: 90 tablet; Refill: 5 - cloNIDine (CATAPRES) 0.1 MG tablet; Take 1 tablet (0.1 mg total) by mouth 3 (three) times daily.  Dispense: 90 tablet; Refill: 5  2. Encounter for immunization - Flu Vaccine QUAD 36+ mos IM   Meds ordered  this encounter  Medications  . ranitidine (ZANTAC) 150 MG tablet    Sig: Take 1 tablet (150 mg total) by mouth 2 (two) times daily.    Dispense:  60 tablet    Refill:  5  . lisinopril-hydrochlorothiazide (PRINZIDE,ZESTORETIC) 20-25 MG tablet    Sig: Take 1 tablet by mouth daily.    Dispense:  30 tablet    Refill:  5  . hydrALAZINE (APRESOLINE) 50 MG tablet    Sig: Take 1 tablet (50 mg total) by mouth 3 (three) times daily.    Dispense:  90 tablet    Refill:  5  . cloNIDine (CATAPRES) 0.1 MG tablet    Sig: Take 1 tablet (0.1 mg total) by mouth 3 (three) times daily.    Dispense:  90 tablet    Refill:  5    Follow-up: Return in about 6 months (around 03/18/2017) for follow up on Hypertension.   Jaclyn Shaggy MD

## 2016-09-22 ENCOUNTER — Ambulatory Visit: Payer: Medicaid Other | Attending: Internal Medicine

## 2016-09-22 DIAGNOSIS — I10 Essential (primary) hypertension: Secondary | ICD-10-CM | POA: Insufficient documentation

## 2016-09-22 LAB — COMPLETE METABOLIC PANEL WITH GFR
ALT: 15 U/L (ref 9–46)
AST: 16 U/L (ref 10–35)
Albumin: 4.2 g/dL (ref 3.6–5.1)
Alkaline Phosphatase: 101 U/L (ref 40–115)
BUN: 10 mg/dL (ref 7–25)
CO2: 31 mmol/L (ref 20–31)
Calcium: 9.4 mg/dL (ref 8.6–10.3)
Chloride: 99 mmol/L (ref 98–110)
Creat: 0.83 mg/dL (ref 0.70–1.33)
GFR, Est Non African American: >89 mL/min (ref >=60)
Glucose, Bld: 114 mg/dL — ABNORMAL HIGH (ref 65–99)
Potassium: 3.4 mmol/L — ABNORMAL LOW (ref 3.5–5.3)
Sodium: 139 mmol/L (ref 135–146)
Total Bilirubin: 0.8 mg/dL (ref 0.2–1.2)
Total Protein: 7 g/dL (ref 6.1–8.1)

## 2016-09-22 LAB — LIPID PANEL
Cholesterol: 157 mg/dL (ref 125–200)
HDL: 42 mg/dL (ref >=40)
LDL CALC: 99 mg/dL (ref <130)
Total CHOL/HDL Ratio: 3.7 Ratio (ref <=5.0)
Triglycerides: 79 mg/dL (ref <150)
VLDL: 16 mg/dL (ref <30)

## 2016-09-22 NOTE — Progress Notes (Signed)
Patient here for lab visit only 

## 2016-09-25 ENCOUNTER — Telehealth: Payer: Self-pay

## 2016-09-25 NOTE — Telephone Encounter (Signed)
Per Dr. Venetia NightAmao patient is aware that his labs were all normal however his potassium was slighly low.  Writer provided a list of the top 10 foods that are high in potassium.  Patient stated an understanding and will begin to include these in his diet.

## 2016-09-25 NOTE — Telephone Encounter (Signed)
-----   Message from Jaclyn ShaggyEnobong Amao, MD sent at 09/25/2016  8:51 AM EDT ----- Normal cholesterol. Mildly decreased potassium; advise to increase intake of potassium rich foods

## 2016-10-27 MED FILL — cloNIDine HCL 0.1 MG TABS: 0.1 | 30 days supply | Qty: 90 | Fill #1

## 2016-10-27 MED FILL — raNITIdine HCL 150 MG TABS: 150 | 30 days supply | Qty: 60 | Fill #1

## 2016-10-27 MED FILL — hydrALAZINE HCL 50 MG TABS: 50 | 30 days supply | Qty: 90 | Fill #1

## 2016-10-27 MED FILL — LISINOPRIL-HCTZ 20-25 MG TA: 20-25 | 30 days supply | Qty: 30 | Fill #1

## 2016-11-27 MED FILL — LISINOPRIL-HCTZ 20-25 MG TA: 20-25 | 30 days supply | Qty: 30 | Fill #2

## 2017-01-03 MED FILL — LISINOPRIL-HCTZ 20-25 MG TA: 20-25 | 30 days supply | Qty: 30 | Fill #3

## 2017-02-28 MED FILL — raNITIdine HCL 150 MG TABS: 150 | 30 days supply | Qty: 60 | Fill #2

## 2017-02-28 MED FILL — LISINOPRIL-HCTZ 20-25 MG TA: 20-25 | 30 days supply | Qty: 30 | Fill #4

## 2017-02-28 MED FILL — hydrALAZINE HCL 50 MG TABS: 50 | 30 days supply | Qty: 90 | Fill #2

## 2017-02-28 MED FILL — cloNIDine HCL 0.1 MG TABS: 0.1 | 30 days supply | Qty: 90 | Fill #2

## 2017-04-04 MED FILL — hydrALAZINE HCL 50 MG TABS: 50 | 30 days supply | Qty: 90 | Fill #3

## 2017-04-04 MED FILL — LISINOPRIL-HCTZ 20-25 MG TA: 20-25 | 30 days supply | Qty: 30 | Fill #5

## 2017-04-04 MED FILL — cloNIDine HCL 0.1 MG TABS: 0.1 | 30 days supply | Qty: 90 | Fill #3

## 2017-04-04 MED FILL — raNITIdine HCL 150 MG TABS: 150 | 30 days supply | Qty: 60 | Fill #3

## 2017-04-16 ENCOUNTER — Ambulatory Visit: Payer: Medicaid Other | Attending: Family Medicine

## 2017-05-10 ENCOUNTER — Other Ambulatory Visit: Payer: Self-pay | Admitting: Family Medicine

## 2017-05-10 DIAGNOSIS — I10 Essential (primary) hypertension: Secondary | ICD-10-CM

## 2017-05-11 ENCOUNTER — Other Ambulatory Visit: Payer: Self-pay | Admitting: Family Medicine

## 2017-05-11 DIAGNOSIS — I10 Essential (primary) hypertension: Secondary | ICD-10-CM

## 2017-05-23 MED FILL — cloNIDine HCL 0.1 MG TABS: 0.1 | 17 days supply | Qty: 51 | Fill #4

## 2017-05-23 MED FILL — hydrALAZINE HCL 50 MG TABS: 50 | 17 days supply | Qty: 51 | Fill #4

## 2017-05-23 MED FILL — raNITIdine HCL 150 MG TABS: 150 | 17 days supply | Qty: 34 | Fill #4

## 2017-06-01 MED FILL — LISINOPRIL-HCTZ 20-25 MG TA: 20-25 | 30 days supply | Qty: 30 | Fill #0

## 2017-07-11 ENCOUNTER — Other Ambulatory Visit: Payer: Self-pay | Admitting: Family Medicine

## 2017-07-11 DIAGNOSIS — I10 Essential (primary) hypertension: Secondary | ICD-10-CM

## 2017-07-11 MED FILL — cloNIDine HCL 0.1 MG TABS: 0.1 | 30 days supply | Qty: 90 | Fill #5

## 2017-07-11 MED FILL — hydrALAZINE HCL 50 MG TABS: 50 | 26 days supply | Qty: 78 | Fill #5

## 2017-07-11 MED FILL — raNITIdine HCL 150 MG TABS: 150 | 30 days supply | Qty: 60 | Fill #5

## 2017-07-18 ENCOUNTER — Other Ambulatory Visit: Payer: Self-pay | Admitting: Family Medicine

## 2017-07-18 DIAGNOSIS — I10 Essential (primary) hypertension: Secondary | ICD-10-CM

## 2017-07-20 ENCOUNTER — Telehealth: Payer: Self-pay | Admitting: Family Medicine

## 2017-07-20 DIAGNOSIS — I10 Essential (primary) hypertension: Secondary | ICD-10-CM

## 2017-07-20 MED ORDER — LISINOPRIL-HYDROCHLOROTHIAZIDE 20-25 MG PO TABS: 1.0000 | ORAL_TABLET | Freq: Every day | ORAL | 0 refills | Status: DC

## 2017-07-20 MED ORDER — HYDRALAZINE HCL 50 MG PO TABS: 50.0000 mg | ORAL_TABLET | Freq: Three times a day (TID) | ORAL | 0 refills | Status: DC

## 2017-07-20 NOTE — Telephone Encounter (Signed)
Pt. Called requesting a refill on his BP medication. Pt. Has scheduled an appt. For 08/20/17. [facility]  Please f/u

## 2017-07-20 NOTE — Telephone Encounter (Signed)
Refilled

## 2017-08-20 ENCOUNTER — Ambulatory Visit: Payer: Self-pay | Attending: Family Medicine | Admitting: Family Medicine

## 2017-08-20 ENCOUNTER — Encounter: Payer: Self-pay | Admitting: Family Medicine

## 2017-08-20 VITALS — BP 190/118 | HR 72 | Temp 98.0°F | Ht 70.0 in | Wt 172.2 lb

## 2017-08-20 DIAGNOSIS — Z716 Tobacco abuse counseling: Secondary | ICD-10-CM | POA: Insufficient documentation

## 2017-08-20 DIAGNOSIS — Z1211 Encounter for screening for malignant neoplasm of colon: Secondary | ICD-10-CM | POA: Insufficient documentation

## 2017-08-20 DIAGNOSIS — Z1159 Encounter for screening for other viral diseases: Secondary | ICD-10-CM

## 2017-08-20 DIAGNOSIS — E785 Hyperlipidemia, unspecified: Secondary | ICD-10-CM | POA: Insufficient documentation

## 2017-08-20 DIAGNOSIS — I10 Essential (primary) hypertension: Secondary | ICD-10-CM | POA: Insufficient documentation

## 2017-08-20 DIAGNOSIS — F17210 Nicotine dependence, cigarettes, uncomplicated: Secondary | ICD-10-CM | POA: Insufficient documentation

## 2017-08-20 DIAGNOSIS — R600 Localized edema: Secondary | ICD-10-CM | POA: Insufficient documentation

## 2017-08-20 DIAGNOSIS — K219 Gastro-esophageal reflux disease without esophagitis: Secondary | ICD-10-CM | POA: Insufficient documentation

## 2017-08-20 DIAGNOSIS — Z720 Tobacco use: Secondary | ICD-10-CM

## 2017-08-20 MED ORDER — LISINOPRIL-HYDROCHLOROTHIAZIDE 20-25 MG PO TABS: 1.0000 | ORAL_TABLET | Freq: Every day | ORAL | 6 refills | Status: DC

## 2017-08-20 MED ORDER — CLONIDINE HCL 0.1 MG PO TABS
0.1000 mg | ORAL_TABLET | Freq: Once | ORAL | Status: AC
  Administered 2017-08-20: 0.1 mg via ORAL

## 2017-08-20 MED ORDER — CLONIDINE HCL 0.1 MG PO TABS: 0.1000 mg | ORAL_TABLET | Freq: Three times a day (TID) | ORAL | 6 refills | Status: DC

## 2017-08-20 MED ORDER — HYDRALAZINE HCL 50 MG PO TABS: 50.0000 mg | ORAL_TABLET | Freq: Three times a day (TID) | ORAL | 6 refills | Status: DC

## 2017-08-20 MED ORDER — RANITIDINE HCL 150 MG PO TABS: 150.0000 mg | ORAL_TABLET | Freq: Two times a day (BID) | ORAL | 6 refills | Status: DC

## 2017-08-20 MED FILL — raNITIdine HCL 150 MG TABS: 150 | 30 days supply | Qty: 60 | Fill #0

## 2017-08-20 MED FILL — LISINOPRIL-HCTZ 20-25 MG TA: 20-25 | 30 days supply | Qty: 30 | Fill #0

## 2017-08-20 MED FILL — cloNIDine HCL 0.1 MG TABS: 0.1 | 30 days supply | Qty: 90 | Fill #0

## 2017-08-20 MED FILL — hydrALAZINE HCL 50 MG TABS: 50 | 30 days supply | Qty: 90 | Fill #0

## 2017-08-20 NOTE — Patient Instructions (Signed)

## 2017-08-20 NOTE — Progress Notes (Signed)
Subjective:  Patient ID: Frank Warren, male    DOB: 1961-12-03  Age: 56 y.o. MRN: [id number]  CC: Hypertension   HPI Frank Warren is a 56 year old male with a history of left-sided hypertension, GERD, tobacco abuse who was last seen in the clinic 11 months ago and presents today for a follow-up visit.  He has been out of his antihypertensives for the last 1 month hence significantly elevated blood pressure of 198/109; clonidine 0.1 mg administered and patient observed for 30 minutes prior to repeat blood pressure.  He has noticed edema of his lower extremities is unsure if this is related to his running out of his diuretic antihypertensive; denies shortness of breath or chest pains.  He continues to smoke one pack to pack of cigarettes per day and is not willing to quit.  Past Medical History:  Diagnosis Date  . Acid reflux disease   . Dyslipidemia   . Hyperlipidemia   . Hypertension   . Tobacco abuse     No past surgical history on file.  No Known Allergies   Outpatient Medications Prior to Visit  Medication Sig Dispense Refill  . cloNIDine (CATAPRES) 0.1 MG tablet Take 1 tablet (0.1 mg total) by mouth 3 (three) times daily. 90 tablet 5  . hydrALAZINE (APRESOLINE) 50 MG tablet Take 1 tablet (50 mg total) by mouth 3 (three) times daily. 90 tablet 0  . ranitidine (ZANTAC) 150 MG tablet Take 1 tablet (150 mg total) by mouth 2 (two) times daily. 60 tablet 5  . nicotine (EQL NICOTINE) 21 mg/24hr patch Place 1 patch (21 mg total) onto the skin daily. (Patient not taking: Reported on 09/18/2016) 28 patch 5  . lisinopril-hydrochlorothiazide (PRINZIDE,ZESTORETIC) 20-25 MG tablet Take 1 tablet by mouth daily. (Patient not taking: Reported on 08/20/2017) 30 tablet 0   No facility-administered medications prior to visit.     ROS Review of Systems  Constitutional: Negative for activity change and appetite change.  HENT: Negative for sinus pressure and sore throat.   Eyes: Negative  for visual disturbance.  Respiratory: Negative for cough, chest tightness and shortness of breath.   Cardiovascular: Positive for leg swelling. Negative for chest pain.  Gastrointestinal: Negative for abdominal distention, abdominal pain, constipation and diarrhea.  Endocrine: Negative.   Genitourinary: Negative for dysuria.  Musculoskeletal: Negative for joint swelling and myalgias.  Skin: Negative for rash.  Allergic/Immunologic: Negative.   Neurological: Negative for weakness, light-headedness and numbness.  Psychiatric/Behavioral: Negative for dysphoric mood and suicidal ideas.    Objective:  BP (!) 198/109   Pulse 72   Temp 98 F (36.7 C) (Oral)   Ht 5\' 10"  (1.778 m)   Wt 172 lb 3.2 oz (78.1 kg)   SpO2 99%   BMI 24.71 kg/m   BP/Weight 08/20/2017 09/18/2016 01/13/2016  Systolic BP 198 159 151  Diastolic BP 109 96 91  Wt. (Lbs) 172.2 170.2 -  BMI 24.71 24.42 -      Physical Exam  Constitutional: He is oriented to person, place, and time. He appears well-developed and well-nourished.  Cardiovascular: Normal rate, normal heart sounds and intact distal pulses.   No murmur heard. Pulmonary/Chest: Effort normal and breath sounds normal. He has no wheezes. He has no rales. He exhibits no tenderness.  Abdominal: Soft. Bowel sounds are normal. He exhibits no distension and no mass. There is no tenderness.  Musculoskeletal: Normal range of motion. He exhibits edema (1+ plus bilateral pitting pedal edema).  Neurological: He is alert  and oriented to person, place, and time.  Skin: Skin is warm and dry.  Psychiatric: He has a normal mood and affect.     Assessment & Plan:   1. Accelerated hypertension Uncontrolled due to running out of medications Clonidine 0.1 mg administered in the clinic and patient observed for 30 minutes prior to repeating blood pressure Refilled medications Low-sodium diet - cloNIDine (CATAPRES) tablet 0.1 mg; Take 1 tablet (0.1 mg total) by mouth  once. - Comprehensive metabolic panel; Future - Lipid panel; Future - lisinopril-hydrochlorothiazide (PRINZIDE,ZESTORETIC) 20-25 MG tablet; Take 1 tablet by mouth daily.  Dispense: 30 tablet; Refill: 6 - hydrALAZINE (APRESOLINE) 50 MG tablet; Take 1 tablet (50 mg total) by mouth 3 (three) times daily.  Dispense: 90 tablet; Refill: 6 - cloNIDine (CATAPRES) 0.1 MG tablet; Take 1 tablet (0.1 mg total) by mouth 3 (three) times daily.  Dispense: 90 tablet; Refill: 6  2. Gastroesophageal reflux disease without esophagitis Stable - ranitidine (ZANTAC) 150 MG tablet; Take 1 tablet (150 mg total) by mouth 2 (two) times daily.  Dispense: 60 tablet; Refill: 6  3. Tobacco abuse disorder Spent 3 minutes counseling on cessation and he is not ready to quit  4. Screening for colon cancer - Fecal occult blood, imunochemical  5. Need for hepatitis C screening test - Hepatitis c antibody (reflex); Future  6. Pedal edema Low-sodium diet Elevate feet, use compression stockings Hopefully refilling his diuretic will improve symptoms  Meds ordered this encounter  Medications  . cloNIDine (CATAPRES) tablet 0.1 mg  . lisinopril-hydrochlorothiazide (PRINZIDE,ZESTORETIC) 20-25 MG tablet    Sig: Take 1 tablet by mouth daily.    Dispense:  30 tablet    Refill:  6    Must have office visit for refills  . hydrALAZINE (APRESOLINE) 50 MG tablet    Sig: Take 1 tablet (50 mg total) by mouth 3 (three) times daily.    Dispense:  90 tablet    Refill:  6  . cloNIDine (CATAPRES) 0.1 MG tablet    Sig: Take 1 tablet (0.1 mg total) by mouth 3 (three) times daily.    Dispense:  90 tablet    Refill:  6  . ranitidine (ZANTAC) 150 MG tablet    Sig: Take 1 tablet (150 mg total) by mouth 2 (two) times daily.    Dispense:  60 tablet    Refill:  6    Follow-up: Return in about 6 months (around 02/20/2018) for Follow-up on hypertension.   Jaclyn Shaggy MD

## 2017-08-21 ENCOUNTER — Ambulatory Visit: Payer: Self-pay | Attending: Family Medicine

## 2017-08-21 DIAGNOSIS — I10 Essential (primary) hypertension: Secondary | ICD-10-CM

## 2017-08-21 DIAGNOSIS — Z1159 Encounter for screening for other viral diseases: Secondary | ICD-10-CM

## 2017-08-21 NOTE — Progress Notes (Signed)
Patient here for lab visit only 

## 2017-08-22 LAB — LIPID PANEL
CHOLESTEROL TOTAL: 155 mg/dL (ref 100–199)
Chol/HDL Ratio: 3.6 ratio (ref 0.0–5.0)
HDL: 43 mg/dL (ref >39)
LDL CALC: 98 mg/dL (ref 0–99)
TRIGLYCERIDES: 71 mg/dL (ref 0–149)
VLDL Cholesterol Cal: 14 mg/dL (ref 5–40)

## 2017-08-22 LAB — COMPREHENSIVE METABOLIC PANEL
ALK PHOS: 123 IU/L — AB (ref 39–117)
ALT: 13 IU/L (ref 0–44)
AST: 16 IU/L (ref 0–40)
Albumin/Globulin Ratio: 1.6 (ref 1.2–2.2)
Albumin: 3.9 g/dL (ref 3.5–5.5)
BILIRUBIN TOTAL: 0.6 mg/dL (ref 0.0–1.2)
BUN / CREAT RATIO: 8 — AB (ref 9–20)
BUN: 8 mg/dL (ref 6–24)
CHLORIDE: 100 mmol/L (ref 96–106)
CO2: 25 mmol/L (ref 20–29)
CREATININE: 0.96 mg/dL (ref 0.76–1.27)
Calcium: 9.2 mg/dL (ref 8.7–10.2)
GFR calc Af Amer: 102 mL/min/{1.73_m2} (ref >59)
GFR calc non Af Amer: 88 mL/min/{1.73_m2} (ref >59)
GLOBULIN, TOTAL: 2.5 g/dL (ref 1.5–4.5)
GLUCOSE: 98 mg/dL (ref 65–99)
Potassium: 3.6 mmol/L (ref 3.5–5.2)
SODIUM: 140 mmol/L (ref 134–144)
Total Protein: 6.4 g/dL (ref 6.0–8.5)

## 2017-08-22 LAB — HEPATITIS C ANTIBODY (REFLEX): HCV Ab: 0.1 s/co ratio (ref 0.0–0.9)

## 2017-08-22 LAB — HCV COMMENT:

## 2017-08-23 ENCOUNTER — Telehealth: Payer: Self-pay

## 2017-08-23 NOTE — Telephone Encounter (Signed)
Pt was called and informed of lab results. 

## 2017-09-24 MED FILL — hydrALAZINE HCL 50 MG TABS: 50 | 30 days supply | Qty: 90 | Fill #1

## 2017-09-24 MED FILL — LISINOPRIL-HCTZ 20-25 MG TA: 20-25 | 30 days supply | Qty: 30 | Fill #1

## 2017-09-24 MED FILL — cloNIDine HCL 0.1 MG TABS: 0.1 | 30 days supply | Qty: 90 | Fill #1

## 2017-09-24 MED FILL — raNITIdine HCL 150 MG TABS: 150 | 30 days supply | Qty: 60 | Fill #1

## 2017-10-29 MED FILL — raNITIdine HCL 150 MG TABS: 150 | 30 days supply | Qty: 60 | Fill #2

## 2017-10-29 MED FILL — cloNIDine HCL 0.1 MG TABS: 0.1 | 30 days supply | Qty: 90 | Fill #2

## 2017-10-29 MED FILL — hydrALAZINE HCL 50 MG TABS: 50 | 30 days supply | Qty: 90 | Fill #2

## 2017-10-29 MED FILL — LISINOPRIL-HCTZ 20-25 MG TA: 20-25 | 30 days supply | Qty: 30 | Fill #2

## 2017-11-29 MED FILL — raNITIdine HCL 150 MG TABS: 150 | 30 days supply | Qty: 60 | Fill #3

## 2017-11-29 MED FILL — hydrALAZINE HCL 50 MG TABS: 50 | 30 days supply | Qty: 90 | Fill #3

## 2017-11-29 MED FILL — cloNIDine HCL 0.1 MG TABS: 0.1 | 30 days supply | Qty: 90 | Fill #3

## 2017-11-29 MED FILL — LISINOPRIL-HCTZ 20-25 MG TA: 20-25 | 30 days supply | Qty: 30 | Fill #3

## 2018-01-07 MED FILL — LISINOPRIL-HCTZ 20-25 MG TA: 20-25 | 30 days supply | Qty: 30 | Fill #4

## 2018-01-07 MED FILL — raNITIdine HCL 150 MG TABS: 150 | 30 days supply | Qty: 60 | Fill #4

## 2018-02-11 MED FILL — raNITIdine HCL 150 MG TABS: 150 | 30 days supply | Qty: 60 | Fill #5

## 2018-02-11 MED FILL — LISINOPRIL-HCTZ 20-25 MG TA: 20-25 | 30 days supply | Qty: 30 | Fill #5

## 2018-03-18 MED FILL — raNITIdine HCL 150 MG TABS: 150 | 30 days supply | Qty: 60 | Fill #6

## 2018-03-18 MED FILL — LISINOPRIL-HCTZ 20-25 MG TA: 20-25 | 30 days supply | Qty: 30 | Fill #6

## 2018-03-18 MED FILL — cloNIDine HCL 0.1 MG TABS: 0.1 | 30 days supply | Qty: 90 | Fill #4

## 2018-03-18 MED FILL — hydrALAZINE HCL 50 MG TABS: 50 | 30 days supply | Qty: 90 | Fill #4

## 2018-04-19 MED FILL — LISINOPRIL-HCTZ 20-25 MG TA: 20-25 | 30 days supply | Qty: 30 | Fill #0

## 2020-01-05 ENCOUNTER — Ambulatory Visit: Payer: No Typology Code available for payment source | Attending: Internal Medicine

## 2020-01-05 DIAGNOSIS — Z20822 Contact with and (suspected) exposure to covid-19: Secondary | ICD-10-CM | POA: Insufficient documentation

## 2020-01-06 LAB — NOVEL CORONAVIRUS, NAA: SARS-CoV-2, NAA: NOT DETECTED

## 2020-01-11 ENCOUNTER — Emergency Department (HOSPITAL_COMMUNITY): Payer: BC Managed Care – PPO

## 2020-01-11 ENCOUNTER — Encounter (HOSPITAL_COMMUNITY): Payer: Self-pay | Admitting: Emergency Medicine

## 2020-01-11 ENCOUNTER — Other Ambulatory Visit: Payer: Self-pay

## 2020-01-11 ENCOUNTER — Observation Stay (HOSPITAL_COMMUNITY)
Admission: EM | Admit: 2020-01-11 | Discharge: 2020-01-12 | Disposition: A | Discharge status: Home / Self Care | Payer: BC Managed Care – PPO | Attending: Surgery | Admitting: Surgery

## 2020-01-11 DIAGNOSIS — E785 Hyperlipidemia, unspecified: Secondary | ICD-10-CM | POA: Diagnosis not present

## 2020-01-11 DIAGNOSIS — S2220XA Unspecified fracture of sternum, initial encounter for closed fracture: Secondary | ICD-10-CM | POA: Diagnosis not present

## 2020-01-11 DIAGNOSIS — K219 Gastro-esophageal reflux disease without esophagitis: Secondary | ICD-10-CM | POA: Diagnosis not present

## 2020-01-11 DIAGNOSIS — Z79899 Other long term (current) drug therapy: Secondary | ICD-10-CM | POA: Diagnosis not present

## 2020-01-11 DIAGNOSIS — Y92410 Unspecified street and highway as the place of occurrence of the external cause: Secondary | ICD-10-CM | POA: Insufficient documentation

## 2020-01-11 DIAGNOSIS — Z20822 Contact with and (suspected) exposure to covid-19: Secondary | ICD-10-CM | POA: Insufficient documentation

## 2020-01-11 DIAGNOSIS — R0789 Other chest pain: Secondary | ICD-10-CM

## 2020-01-11 DIAGNOSIS — I10 Essential (primary) hypertension: Secondary | ICD-10-CM | POA: Diagnosis not present

## 2020-01-11 DIAGNOSIS — R0781 Pleurodynia: Secondary | ICD-10-CM

## 2020-01-11 DIAGNOSIS — F17210 Nicotine dependence, cigarettes, uncomplicated: Secondary | ICD-10-CM | POA: Insufficient documentation

## 2020-01-11 DIAGNOSIS — R079 Chest pain, unspecified: Secondary | ICD-10-CM | POA: Diagnosis not present

## 2020-01-11 LAB — TROPONIN I (HIGH SENSITIVITY)
Troponin I (High Sensitivity): 10 ng/L (ref <18)
Troponin I (High Sensitivity): 11 ng/L (ref <18)

## 2020-01-11 LAB — URINALYSIS, ROUTINE W REFLEX MICROSCOPIC
Bacteria, UA: NONE SEEN
Bilirubin Urine: NEGATIVE
Glucose, UA: NEGATIVE mg/dL
Ketones, ur: NEGATIVE mg/dL
Leukocytes,Ua: NEGATIVE
Nitrite: NEGATIVE
Protein, ur: NEGATIVE mg/dL
Specific Gravity, Urine: 1.036 — ABNORMAL HIGH (ref 1.005–1.030)
pH: 6 (ref 5.0–8.0)

## 2020-01-11 LAB — CBC WITH DIFFERENTIAL/PLATELET
Abs Immature Granulocytes: 0.05 10*3/uL (ref 0.00–0.07)
Basophils Absolute: 0 10*3/uL (ref 0.0–0.1)
Basophils Relative: 0 %
Eosinophils Absolute: 0.1 10*3/uL (ref 0.0–0.5)
Eosinophils Relative: 1 %
HCT: 47.3 % (ref 39.0–52.0)
Hemoglobin: 15.8 g/dL (ref 13.0–17.0)
Immature Granulocytes: 1 %
Lymphocytes Relative: 10 %
Lymphs Abs: 0.8 10*3/uL (ref 0.7–4.0)
MCH: 28.1 pg (ref 26.0–34.0)
MCHC: 33.4 g/dL (ref 30.0–36.0)
MCV: 84 fL (ref 80.0–100.0)
Monocytes Absolute: 0.4 10*3/uL (ref 0.1–1.0)
Monocytes Relative: 5 %
Neutro Abs: 6.3 10*3/uL (ref 1.7–7.7)
Neutrophils Relative %: 83 %
Platelets: 165 10*3/uL (ref 150–400)
RBC: 5.63 MIL/uL (ref 4.22–5.81)
RDW: 13.1 % (ref 11.5–15.5)
WBC: 7.7 10*3/uL (ref 4.0–10.5)
nRBC: 0 % (ref 0.0–0.2)

## 2020-01-11 LAB — COMPREHENSIVE METABOLIC PANEL
ALT: 17 U/L (ref 0–44)
AST: 24 U/L (ref 15–41)
Albumin: 3.6 g/dL (ref 3.5–5.0)
Alkaline Phosphatase: 96 U/L (ref 38–126)
Anion gap: 9 (ref 5–15)
BUN: 13 mg/dL (ref 6–20)
CO2: 29 mmol/L (ref 22–32)
Calcium: 9.1 mg/dL (ref 8.9–10.3)
Chloride: 100 mmol/L (ref 98–111)
Creatinine, Ser: 0.99 mg/dL (ref 0.61–1.24)
GFR calc Af Amer: >60 mL/min (ref >60)
GFR calc non Af Amer: >60 mL/min (ref >60)
Glucose, Bld: 127 mg/dL — ABNORMAL HIGH (ref 70–99)
Potassium: 3.2 mmol/L — ABNORMAL LOW (ref 3.5–5.1)
Sodium: 138 mmol/L (ref 135–145)
Total Bilirubin: 0.6 mg/dL (ref 0.3–1.2)
Total Protein: 7 g/dL (ref 6.5–8.1)

## 2020-01-11 LAB — LIPASE, BLOOD: Lipase: 81 U/L — ABNORMAL HIGH (ref 11–51)

## 2020-01-11 MED ORDER — FAMOTIDINE 20 MG PO TABS
20.0000 mg | ORAL_TABLET | Freq: Two times a day (BID) | ORAL | Status: DC
  Administered 2020-01-11 – 2020-01-12 (×2): 20 mg via ORAL
  Filled 2020-01-11 (×2): qty 1

## 2020-01-11 MED ORDER — FENTANYL CITRATE (PF) 100 MCG/2ML IJ SOLN
50.0000 ug | Freq: Once | INTRAMUSCULAR | Status: AC
  Administered 2020-01-11: 21:00:00 50 ug via INTRAVENOUS
  Filled 2020-01-11: qty 2

## 2020-01-11 MED ORDER — IBUPROFEN 400 MG PO TABS
600.0000 mg | ORAL_TABLET | Freq: Four times a day (QID) | ORAL | Status: DC | PRN
  Administered 2020-01-12 (×2): 600 mg via ORAL
  Filled 2020-01-11 (×2): qty 1

## 2020-01-11 MED ORDER — ONDANSETRON 4 MG PO TBDP: 4.0000 mg | ORAL_TABLET | Freq: Four times a day (QID) | ORAL | Status: DC | PRN

## 2020-01-11 MED ORDER — DOCUSATE SODIUM 100 MG PO CAPS
100.0000 mg | ORAL_CAPSULE | Freq: Two times a day (BID) | ORAL | Status: DC
  Administered 2020-01-12: 09:00:00 100 mg via ORAL
  Filled 2020-01-11 (×2): qty 1

## 2020-01-11 MED ORDER — IOHEXOL 300 MG/ML  SOLN
100.0000 mL | Freq: Once | INTRAMUSCULAR | Status: AC | PRN
  Administered 2020-01-11: 22:00:00 100 mL via INTRAVENOUS

## 2020-01-11 MED ORDER — HYDROCHLOROTHIAZIDE 25 MG PO TABS
25.0000 mg | ORAL_TABLET | Freq: Every day | ORAL | Status: DC
  Administered 2020-01-12: 09:00:00 25 mg via ORAL
  Filled 2020-01-11: qty 1

## 2020-01-11 MED ORDER — CLONIDINE HCL 0.1 MG PO TABS
0.1000 mg | ORAL_TABLET | Freq: Three times a day (TID) | ORAL | Status: DC
  Administered 2020-01-11 – 2020-01-12 (×3): 0.1 mg via ORAL
  Filled 2020-01-11 (×3): qty 1

## 2020-01-11 MED ORDER — ONDANSETRON HCL 4 MG/2ML IJ SOLN
4.0000 mg | Freq: Four times a day (QID) | INTRAMUSCULAR | Status: DC | PRN
  Administered 2020-01-12: 11:00:00 4 mg via INTRAVENOUS
  Filled 2020-01-11: qty 2

## 2020-01-11 MED ORDER — LISINOPRIL 20 MG PO TABS
20.0000 mg | ORAL_TABLET | Freq: Every day | ORAL | Status: DC
  Administered 2020-01-12: 09:00:00 20 mg via ORAL
  Filled 2020-01-11: qty 1

## 2020-01-11 MED ORDER — HYDROMORPHONE HCL 1 MG/ML IJ SOLN
0.5000 mg | INTRAMUSCULAR | Status: DC | PRN
  Administered 2020-01-12: 0.5 mg via INTRAVENOUS
  Filled 2020-01-11: qty 1

## 2020-01-11 MED ORDER — LISINOPRIL-HYDROCHLOROTHIAZIDE 20-25 MG PO TABS: 1.0000 | ORAL_TABLET | Freq: Every day | ORAL | Status: DC

## 2020-01-11 MED ORDER — TRAMADOL HCL 50 MG PO TABS
50.0000 mg | ORAL_TABLET | Freq: Four times a day (QID) | ORAL | Status: DC | PRN
  Administered 2020-01-12: 02:00:00 50 mg via ORAL
  Filled 2020-01-11: qty 1

## 2020-01-11 MED ORDER — HYDRALAZINE HCL 50 MG PO TABS
50.0000 mg | ORAL_TABLET | Freq: Three times a day (TID) | ORAL | Status: DC
  Administered 2020-01-11 – 2020-01-12 (×3): 50 mg via ORAL
  Filled 2020-01-11: qty 2
  Filled 2020-01-11 (×2): qty 1

## 2020-01-11 MED ORDER — ACETAMINOPHEN 500 MG PO TABS: 1000.0000 mg | ORAL_TABLET | Freq: Four times a day (QID) | ORAL | Status: DC | PRN

## 2020-01-11 MED ORDER — LACTATED RINGERS IV SOLN: INTRAVENOUS | Status: DC
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
## 2020-01-11 NOTE — ED Notes (Signed)
Pt c/o central chest discomfort, worse with breathing, pain reporducable
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
## 2020-01-12 ENCOUNTER — Observation Stay (HOSPITAL_COMMUNITY): Payer: BC Managed Care – PPO

## 2020-01-12 ENCOUNTER — Other Ambulatory Visit: Payer: Self-pay

## 2020-01-12 DIAGNOSIS — S2220XA Unspecified fracture of sternum, initial encounter for closed fracture: Secondary | ICD-10-CM | POA: Diagnosis not present

## 2020-01-12 LAB — CBC
HCT: 43.1 % (ref 39.0–52.0)
Hemoglobin: 14.3 g/dL (ref 13.0–17.0)
MCH: 27.9 pg (ref 26.0–34.0)
MCHC: 33.2 g/dL (ref 30.0–36.0)
MCV: 84 fL (ref 80.0–100.0)
Platelets: 169 10*3/uL (ref 150–400)
RBC: 5.13 MIL/uL (ref 4.22–5.81)
RDW: 13.2 % (ref 11.5–15.5)
WBC: 7.6 10*3/uL (ref 4.0–10.5)
nRBC: 0 % (ref 0.0–0.2)

## 2020-01-12 LAB — SARS CORONAVIRUS 2 (TAT 6-24 HRS): SARS Coronavirus 2: NEGATIVE

## 2020-01-12 LAB — HIV ANTIBODY (ROUTINE TESTING W REFLEX): HIV Screen 4th Generation wRfx: NONREACTIVE

## 2020-01-12 MED ORDER — TRAMADOL HCL 50 MG PO TABS: 50.0000 mg | ORAL_TABLET | Freq: Four times a day (QID) | ORAL | 0 refills | Status: DC | PRN

## 2020-01-12 NOTE — Progress Notes (Addendum)
Pt report pain improved and feeling better. Pt wants to be discharge home. Pt IV and telemetry removed; pt discharge education and instructions completed with pt and spouse at bedside; both voices understanding and denies any questions. Pt to pick up electronically sent prescription from preferred pharmacy on file. Pt offered wheelchair but he declined and ambulated off unit with spouse and belongings to the side along with RN. Dionne Bucy RN
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
## 2020-01-12 NOTE — Progress Notes (Signed)
Call from nurse, patient having new onset R sided rib pain after walking in halls. He is concerned about this pain. I showed him his CT and informed him and his wife that this pain is likely related to his sternal frx. He is not SOB. No associated symptoms.   PE: Well appearing, NAD, rate and effort of breathing is normal, TTP of mid sternum and of R mid/lower ribs. No TTP of RUQ or abdomen.   Will check a CXR and if he feels better in a few hours he can be discharged. If any concerns on CXR or pt's pain is not controlled then he will stay.   Mattie Marlin, [facility]
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
## 2020-02-02 ENCOUNTER — Other Ambulatory Visit: Payer: Self-pay

## 2020-02-02 ENCOUNTER — Encounter: Payer: Self-pay | Admitting: Family Medicine

## 2020-02-02 ENCOUNTER — Ambulatory Visit: Payer: BC Managed Care – PPO | Attending: Family Medicine | Admitting: Family Medicine

## 2020-02-02 VITALS — BP 115/77 | HR 74 | Ht 68.0 in | Wt 162.0 lb

## 2020-02-02 DIAGNOSIS — K219 Gastro-esophageal reflux disease without esophagitis: Secondary | ICD-10-CM | POA: Diagnosis not present

## 2020-02-02 DIAGNOSIS — S2222XA Fracture of body of sternum, initial encounter for closed fracture: Secondary | ICD-10-CM

## 2020-02-02 DIAGNOSIS — Z720 Tobacco use: Secondary | ICD-10-CM

## 2020-02-02 DIAGNOSIS — I10 Essential (primary) hypertension: Secondary | ICD-10-CM | POA: Diagnosis not present

## 2020-02-02 DIAGNOSIS — F17210 Nicotine dependence, cigarettes, uncomplicated: Secondary | ICD-10-CM

## 2020-02-02 MED ORDER — FAMOTIDINE 20 MG PO TABS: 20.0000 mg | ORAL_TABLET | Freq: Every day | ORAL | 6 refills | Status: DC

## 2020-02-02 MED ORDER — CLONIDINE HCL 0.1 MG PO TABS: 0.1000 mg | ORAL_TABLET | Freq: Three times a day (TID) | ORAL | 6 refills | Status: DC

## 2020-02-02 MED ORDER — HYDRALAZINE HCL 50 MG PO TABS: 50.0000 mg | ORAL_TABLET | Freq: Three times a day (TID) | ORAL | 6 refills | Status: DC

## 2020-02-02 MED ORDER — LISINOPRIL-HYDROCHLOROTHIAZIDE 20-25 MG PO TABS: 1.0000 | ORAL_TABLET | Freq: Every day | ORAL | 6 refills | Status: DC
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
## 2020-02-03 ENCOUNTER — Telehealth: Payer: Self-pay | Admitting: Family Medicine

## 2020-02-03 ENCOUNTER — Encounter: Payer: Self-pay | Admitting: Family Medicine

## 2020-02-03 MED ORDER — TRAMADOL HCL 50 MG PO TABS: 50.0000 mg | ORAL_TABLET | Freq: Four times a day (QID) | ORAL | 0 refills | Status: DC | PRN

## 2020-02-03 NOTE — Telephone Encounter (Signed)
Pt call to request a refill for traMADol (ULTRAM) 50 MG tablet [[id number]] [id number] [place], [place] - [address] AT [facility] [address]  [address] [place], [place] [address]  Phone:  The prescription was sent on 01/12/20, he want to be sent the med now since he need want to talk the nurse or the PCP

## 2020-02-03 NOTE — Telephone Encounter (Signed)
Requesting refill for Tramadol. Last fill 01/12/20

## 2020-02-03 NOTE — Telephone Encounter (Signed)
Done

## 2020-02-04 MED FILL — traMADol HCL 50 MG TABS: 50 | 7 days supply | Qty: 30 | Fill #0

## 2020-04-17 ENCOUNTER — Ambulatory Visit: Payer: Self-pay | Attending: Internal Medicine

## 2020-04-17 DIAGNOSIS — Z23 Encounter for immunization: Secondary | ICD-10-CM

## 2020-04-17 NOTE — Progress Notes (Signed)
   Covid-19 Vaccination Clinic  Name:  Frank Warren    MRN: [id number] DOB: [birth year]  04/17/2020  Mr. Surgeon was observed post Covid-19 immunization for 15 minutes without incident. He was provided with Vaccine Information Sheet and instruction to access the V-Safe system.   [name] was instructed to call 911 with any severe reactions post vaccine: Marland Kitchen Difficulty breathing  . Swelling of face and throat  . A fast heartbeat  . A bad rash all over body  . Dizziness and weakness   Immunizations Administered    Name Date Dose VIS Date Route   Pfizer COVID-19 Vaccine 04/17/2020 12:15 PM 0.3 mL 02/18/2019 Intramuscular   Manufacturer: [employer], [employer]   Lot: [id number]   NDC: [address]-2

## 2020-05-03 ENCOUNTER — Ambulatory Visit: Payer: BC Managed Care – PPO | Attending: Family Medicine | Admitting: Family Medicine

## 2020-05-03 ENCOUNTER — Encounter: Payer: Self-pay | Admitting: Family Medicine

## 2020-05-03 ENCOUNTER — Other Ambulatory Visit: Payer: Self-pay

## 2020-05-03 DIAGNOSIS — Z0000 Encounter for general adult medical examination without abnormal findings: Secondary | ICD-10-CM | POA: Diagnosis not present

## 2020-05-03 DIAGNOSIS — K219 Gastro-esophageal reflux disease without esophagitis: Secondary | ICD-10-CM | POA: Diagnosis not present

## 2020-05-03 DIAGNOSIS — I10 Essential (primary) hypertension: Secondary | ICD-10-CM | POA: Diagnosis not present

## 2020-05-03 MED ORDER — HYDRALAZINE HCL 50 MG PO TABS: 50.0000 mg | ORAL_TABLET | Freq: Three times a day (TID) | ORAL | 1 refills | Status: DC

## 2020-05-03 MED ORDER — LISINOPRIL-HYDROCHLOROTHIAZIDE 20-25 MG PO TABS: 1.0000 | ORAL_TABLET | Freq: Every day | ORAL | 1 refills | Status: DC

## 2020-05-03 MED ORDER — FAMOTIDINE 20 MG PO TABS: 20.0000 mg | ORAL_TABLET | Freq: Every day | ORAL | 1 refills | Status: DC

## 2020-05-03 MED ORDER — CLONIDINE HCL 0.1 MG PO TABS: 0.1000 mg | ORAL_TABLET | Freq: Three times a day (TID) | ORAL | 1 refills | Status: DC
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
## 2020-05-04 ENCOUNTER — Other Ambulatory Visit: Payer: Self-pay

## 2020-05-04 ENCOUNTER — Ambulatory Visit: Payer: BC Managed Care – PPO | Attending: Family Medicine

## 2020-05-04 DIAGNOSIS — I10 Essential (primary) hypertension: Secondary | ICD-10-CM | POA: Diagnosis not present

## 2020-05-05 LAB — CMP14+EGFR
ALT: 13 IU/L (ref 0–44)
AST: 14 IU/L (ref 0–40)
Albumin/Globulin Ratio: 1.6 (ref 1.2–2.2)
Albumin: 4 g/dL (ref 3.8–4.9)
Alkaline Phosphatase: 151 IU/L — ABNORMAL HIGH (ref 39–117)
BUN/Creatinine Ratio: 14 (ref 9–20)
BUN: 14 mg/dL (ref 6–24)
Bilirubin Total: 0.2 mg/dL (ref 0.0–1.2)
CO2: 27 mmol/L (ref 20–29)
Calcium: 9.6 mg/dL (ref 8.7–10.2)
Chloride: 102 mmol/L (ref 96–106)
Creatinine, Ser: 0.97 mg/dL (ref 0.76–1.27)
GFR calc Af Amer: 98 mL/min/{1.73_m2} (ref >59)
GFR calc non Af Amer: 85 mL/min/{1.73_m2} (ref >59)
Globulin, Total: 2.5 g/dL (ref 1.5–4.5)
Glucose: 117 mg/dL — ABNORMAL HIGH (ref 65–99)
Potassium: 3.5 mmol/L (ref 3.5–5.2)
Sodium: 142 mmol/L (ref 134–144)
Total Protein: 6.5 g/dL (ref 6.0–8.5)

## 2020-05-05 LAB — LIPID PANEL
Chol/HDL Ratio: 4.3 ratio (ref 0.0–5.0)
Cholesterol, Total: 176 mg/dL (ref 100–199)
HDL: 41 mg/dL (ref >39)
LDL Chol Calc (NIH): 106 mg/dL — ABNORMAL HIGH (ref 0–99)
Triglycerides: 168 mg/dL — ABNORMAL HIGH (ref 0–149)
VLDL Cholesterol Cal: 29 mg/dL (ref 5–40)

## 2020-05-15 ENCOUNTER — Ambulatory Visit: Payer: Self-pay | Attending: Internal Medicine

## 2020-05-15 DIAGNOSIS — Z23 Encounter for immunization: Secondary | ICD-10-CM

## 2020-05-15 NOTE — Progress Notes (Signed)
   Covid-19 Vaccination Clinic  Name:  Frank Warren    MRN: [id number] DOB: Jul 27, 1961  05/15/2020  Mr. Frank Warren was observed post Covid-19 immunization for 15 minutes without incident. He was provided with Vaccine Information Sheet and instruction to access the V-Safe system.   Mr. Frank Warren was instructed to call 911 with any severe reactions post vaccine: Marland Kitchen Difficulty breathing  . Swelling of face and throat  . A fast heartbeat  . A bad rash all over body  . Dizziness and weakness   Immunizations Administered    Name Date Dose VIS Date Route   Pfizer COVID-19 Vaccine 05/15/2020 12:44 PM 0.3 mL 02/18/2019 Intramuscular   Manufacturer: [employer], [employer]   Lot: [id number]   NDC: [address]-5

## 2020-07-11 DIAGNOSIS — Z20822 Contact with and (suspected) exposure to covid-19: Secondary | ICD-10-CM | POA: Diagnosis not present
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
## 2020-12-27 DIAGNOSIS — K219 Gastro-esophageal reflux disease without esophagitis: Secondary | ICD-10-CM | POA: Diagnosis not present

## 2020-12-27 DIAGNOSIS — I10 Essential (primary) hypertension: Secondary | ICD-10-CM | POA: Diagnosis not present

## 2021-01-25 DIAGNOSIS — R7303 Prediabetes: Secondary | ICD-10-CM | POA: Diagnosis not present

## 2021-01-25 DIAGNOSIS — I10 Essential (primary) hypertension: Secondary | ICD-10-CM | POA: Diagnosis not present

## 2021-01-25 DIAGNOSIS — F17200 Nicotine dependence, unspecified, uncomplicated: Secondary | ICD-10-CM | POA: Diagnosis not present

## 2021-01-25 DIAGNOSIS — K219 Gastro-esophageal reflux disease without esophagitis: Secondary | ICD-10-CM | POA: Diagnosis not present

## 2021-02-22 ENCOUNTER — Telehealth: Payer: Self-pay | Admitting: Family Medicine

## 2021-02-22 DIAGNOSIS — I10 Essential (primary) hypertension: Secondary | ICD-10-CM

## 2021-02-22 DIAGNOSIS — K219 Gastro-esophageal reflux disease without esophagitis: Secondary | ICD-10-CM

## 2021-02-22 MED ORDER — CLONIDINE HCL 0.1 MG PO TABS: 0.1000 mg | ORAL_TABLET | Freq: Three times a day (TID) | ORAL | 0 refills | Status: DC

## 2021-02-22 MED ORDER — FAMOTIDINE 20 MG PO TABS: 20.0000 mg | ORAL_TABLET | Freq: Every day | ORAL | 0 refills | Status: DC

## 2021-02-22 MED ORDER — LISINOPRIL-HYDROCHLOROTHIAZIDE 20-25 MG PO TABS: 1.0000 | ORAL_TABLET | Freq: Every day | ORAL | 0 refills | Status: DC

## 2021-02-22 MED ORDER — HYDRALAZINE HCL 50 MG PO TABS: 50.0000 mg | ORAL_TABLET | Freq: Three times a day (TID) | ORAL | 0 refills | Status: DC

## 2021-02-22 NOTE — Telephone Encounter (Signed)
Rx sent 

## 2021-02-22 NOTE — Telephone Encounter (Signed)
Patient came in to schedule an appointment with [facility]. First available was March 30th. Patient needs refills of all his medications, except tramadol. Could patient get refills until then? Please advise.

## 2021-02-22 NOTE — Telephone Encounter (Signed)
Called patient and LVM advising patient that his medications had been sent to the [facility] pharmacy on [address]. Advised patient to call with any questions or concerns.

## 2021-03-23 ENCOUNTER — Ambulatory Visit: Payer: No Typology Code available for payment source | Attending: Family Medicine | Admitting: Family Medicine

## 2021-03-23 ENCOUNTER — Other Ambulatory Visit: Payer: Self-pay

## 2021-03-23 VITALS — BP 173/116 | HR 87 | Ht 69.0 in | Wt 165.2 lb

## 2021-03-23 DIAGNOSIS — K219 Gastro-esophageal reflux disease without esophagitis: Secondary | ICD-10-CM | POA: Diagnosis not present

## 2021-03-23 DIAGNOSIS — I10 Essential (primary) hypertension: Secondary | ICD-10-CM | POA: Diagnosis not present

## 2021-03-23 DIAGNOSIS — Z1211 Encounter for screening for malignant neoplasm of colon: Secondary | ICD-10-CM | POA: Diagnosis not present

## 2021-03-23 MED ORDER — LISINOPRIL-HYDROCHLOROTHIAZIDE 20-25 MG PO TABS: 1.0000 | ORAL_TABLET | Freq: Every day | ORAL | 1 refills | Status: DC

## 2021-03-23 MED ORDER — CLONIDINE HCL 0.1 MG PO TABS: 0.1000 mg | ORAL_TABLET | Freq: Three times a day (TID) | ORAL | 1 refills | Status: DC

## 2021-03-23 MED ORDER — HYDRALAZINE HCL 50 MG PO TABS: 50.0000 mg | ORAL_TABLET | Freq: Three times a day (TID) | ORAL | 1 refills | Status: DC

## 2021-03-23 NOTE — Patient Instructions (Signed)

## 2021-03-23 NOTE — Progress Notes (Signed)
Subjective:  Patient ID: Frank Warren, male    DOB: Jan 18, 1961  Age: 60 y.o. MRN: [id number]  CC: Hypertension   HPI Frank Warren is a 60 year old male with history of hypertension, GERD who presents today for follow-up visit.  His BP is elevated and he forgot to take his antihypertensive today. Sometimes has headache after long hours of sleep.  Previous blood pressures that office visit have been controlled. He has no chest pains or dyspnea. Currently on Pepcid for his GERD. Declines Clonoscopy Past Medical History:  Diagnosis Date  . Acid reflux disease   . Dyslipidemia   . Hyperlipidemia   . Hypertension   . Tobacco abuse     No past surgical history on file.  No family history on file.  No Known Allergies  Outpatient Medications Prior to Visit  Medication Sig Dispense Refill  . famotidine (PEPCID) 20 MG tablet Take 1 tablet (20 mg total) by mouth daily. 30 tablet 0  . cloNIDine (CATAPRES) 0.1 MG tablet Take 1 tablet (0.1 mg total) by mouth 3 (three) times daily. 90 tablet 0  . hydrALAZINE (APRESOLINE) 50 MG tablet Take 1 tablet (50 mg total) by mouth 3 (three) times daily. 90 tablet 0  . lisinopril-hydrochlorothiazide (ZESTORETIC) 20-25 MG tablet Take 1 tablet by mouth daily. 30 tablet 0  . traMADol (ULTRAM) 50 MG tablet Take 1 tablet (50 mg total) by mouth every 6 (six) hours as needed (pain not controlled with tylenol and ibuprofen). (Patient not taking: Reported on 03/23/2021) 30 tablet 0   No facility-administered medications prior to visit.     ROS Review of Systems  Constitutional: Negative for activity change and appetite change.  HENT: Negative for sinus pressure and sore throat.   Eyes: Negative for visual disturbance.  Respiratory: Negative for cough, chest tightness and shortness of breath.   Cardiovascular: Negative for chest pain and leg swelling.  Gastrointestinal: Negative for abdominal distention, abdominal pain, constipation and diarrhea.   Endocrine: Negative.   Genitourinary: Negative for dysuria.  Musculoskeletal: Negative for joint swelling and myalgias.  Skin: Negative for rash.  Allergic/Immunologic: Negative.   Neurological: Negative for weakness, light-headedness and numbness.  Psychiatric/Behavioral: Negative for dysphoric mood and suicidal ideas.    Objective:  BP (!) 173/116 (BP Location: Right Arm, Patient Position: Sitting)   Pulse 87   Ht 5\' 9"  (1.753 m)   Wt 165 lb 3.2 oz (74.9 kg)   SpO2 97%   BMI 24.40 kg/m   BP/Weight 03/23/2021 02/02/2020 01/12/2020  Systolic BP 173 115 126  Diastolic BP 116 77 82  Wt. (Lbs) 165.2 162 -  BMI 24.4 24.63 -      Physical Exam Constitutional:      Appearance: He is well-developed.  Neck:     Vascular: No JVD.  Cardiovascular:     Rate and Rhythm: Normal rate.     Heart sounds: Normal heart sounds. No murmur heard.   Pulmonary:     Effort: Pulmonary effort is normal.     Breath sounds: Normal breath sounds. No wheezing or rales.  Chest:     Chest wall: No tenderness.  Abdominal:     General: Bowel sounds are normal. There is no distension.     Palpations: Abdomen is soft. There is no mass.     Tenderness: There is no abdominal tenderness.  Musculoskeletal:        General: Normal range of motion.     Right lower leg: No edema.  Left lower leg: No edema.  Neurological:     Mental Status: He is alert and oriented to person, place, and time.  Psychiatric:        Mood and Affect: Mood normal.     CMP Latest Ref Rng & Units 05/04/2020 01/11/2020 08/21/2017  Glucose 65 - 99 mg/dL 099(I) 338(S) 98  BUN 6 - 24 mg/dL 14 13 8   Creatinine 0.76 - 1.27 mg/dL 5.05 3.97  Sodium 134 - 144 mmol/L 142 138 140  Potassium 3.5 - 5.2 mmol/L 3.5 3.2(L) 3.6  Chloride 96 - 106 mmol/L 102 100 100  CO2 20 - 29 mmol/L 27 29 25   Calcium 8.7 - 10.2 mg/dL 9.6 9.1 9.2  Total Protein 6.0 - 8.5 g/dL 6.5 7.0 6.4  Total Bilirubin 0.0 - 1.2 mg/dL 0.2 0.6 0.6  Alkaline Phos  39 - 117 IU/L 151(H) 96 123(H)  AST 0 - 40 IU/L 14 24 16   ALT 0 - 44 IU/L 13 17 13     Lipid Panel     Component Value Date/Time   CHOL 176 05/04/2020 1000   TRIG 168 (H) 05/04/2020 1000   HDL 41 05/04/2020 1000   CHOLHDL 4.3 05/04/2020 1000   CHOLHDL 3.7 09/22/2016 1356   VLDL 16 09/22/2016 1356   LDLCALC 106 (H) 05/04/2020 1000    CBC    Component Value Date/Time   WBC 7.6 01/12/2020 0453   RBC 5.13 01/12/2020 0453   HGB 14.3 01/12/2020 0453   HCT 43.1 01/12/2020 0453   PLT 169 01/12/2020 0453   MCV 84.0 01/12/2020 0453   MCH 27.9 01/12/2020 0453   MCHC 33.2 01/12/2020 0453   RDW 13.2 01/12/2020 0453   LYMPHSABS 0.8 01/11/2020 2046   MONOABS 0.4 01/11/2020 2046   EOSABS 0.1 01/11/2020 2046   BASOSABS 0.0 01/11/2020 2046    Lab Results  Component Value Date   HGBA1C 5.6 06/12/2013    Assessment & Plan:  1. Essential hypertension Uncontrolled due to the fact that he is yet to take his medications Advised to be more compliant and he will will return for visit in 1 week with the clinical pharmacist to evaluate his blood pressure Counseled on blood pressure goal of less than 130/80, low-sodium, DASH diet, medication compliance, 150 minutes of moderate intensity exercise per week. Discussed medication compliance, adverse effects. - Basic Metabolic Panel - lisinopril-hydrochlorothiazide (ZESTORETIC) 20-25 MG tablet; Take 1 tablet by mouth daily.  Dispense: 90 tablet; Refill: 1 - hydrALAZINE (APRESOLINE) 50 MG tablet; Take 1 tablet (50 mg total) by mouth 3 (three) times daily.  Dispense: 90 tablet; Refill: 1 - cloNIDine (CATAPRES) 0.1 MG tablet; Take 1 tablet (0.1 mg total) by mouth 3 (three) times daily.  Dispense: 90 tablet; Refill: 1  2. Screening for colon cancer - Cologuard  3. Gastroesophageal reflux disease without esophagitis Controlled on Pepcid   Meds ordered this encounter  Medications  . lisinopril-hydrochlorothiazide (ZESTORETIC) 20-25 MG tablet     Sig: Take 1 tablet by mouth daily.    Dispense:  90 tablet    Refill:  1  . hydrALAZINE (APRESOLINE) 50 MG tablet    Sig: Take 1 tablet (50 mg total) by mouth 3 (three) times daily.    Dispense:  90 tablet    Refill:  1  . cloNIDine (CATAPRES) 0.1 MG tablet    Sig: Take 1 tablet (0.1 mg total) by mouth 3 (three) times daily.    Dispense:  90 tablet    Refill:  1    Follow-up: Return in about 1 week (around 03/30/2021) for BP evaluation with Franky Macho; 6 months with PCP.       Hoy Register, MD, FAAFP. [facility] [place], [place] [phone]   03/23/2021, 1:16 PM

## 2021-03-23 NOTE — Progress Notes (Signed)
Has not taken meds since last night B/P

## 2021-03-24 ENCOUNTER — Other Ambulatory Visit: Payer: Self-pay | Admitting: Family Medicine

## 2021-03-24 LAB — BASIC METABOLIC PANEL
BUN/Creatinine Ratio: 16 (ref 10–24)
BUN: 13 mg/dL (ref 8–27)
CO2: 24 mmol/L (ref 20–29)
Calcium: 9.5 mg/dL (ref 8.6–10.2)
Chloride: 98 mmol/L (ref 96–106)
Creatinine, Ser: 0.83 mg/dL (ref 0.76–1.27)
Glucose: 150 mg/dL — ABNORMAL HIGH (ref 65–99)
Potassium: 3.2 mmol/L — ABNORMAL LOW (ref 3.5–5.2)
Sodium: 141 mmol/L (ref 134–144)
eGFR: 100 mL/min/{1.73_m2} (ref >59)

## 2021-03-24 MED ORDER — POTASSIUM CHLORIDE ER 10 MEQ PO TBCR: 10.0000 meq | EXTENDED_RELEASE_TABLET | Freq: Every day | ORAL | 6 refills | Status: DC

## 2021-03-25 ENCOUNTER — Telehealth: Payer: Self-pay

## 2021-03-25 NOTE — Telephone Encounter (Signed)
-----   Message from Hoy Register, MD sent at 03/24/2021  8:49 AM EDT ----- Please inform him that his potassium is low at 3.2 which could be as a result of his blood pressure medication.  I have sent a prescription for potassium to his pharmacy.  He can also eat foods that are high in potassium like bananas, nuts.

## 2021-03-25 NOTE — Telephone Encounter (Signed)
Patient name and DOB has been verified Patient was informed of lab results. Patient had no questions.  

## 2021-03-29 NOTE — Progress Notes (Signed)
   S:     PCP: Dr. Alvis Lemmings  Patient arrives in good spirits. Presents to the clinic for hypertension evaluation, counseling, and management. Patient was referred and last seen by Primary Care Provider on 03/23/21. At that visit, BP was elevated at 173/116, however, pt forgot to take HTN medications prior to visit. No medication changes were made as previous office visit BP was controlled.  Today, patient reports he sometimes forgets to take his HTN medications during the week. Reports he took HTN medications this morning around 5AM. Denies dizziness, headaches, blurred vision and LE swelling. Reports not having a BP monitor at home to check BP.  Medication adherence reported fair.  Current BP Medications include: Clonidine 0.1 mg TID, hydralazine 50 mg TID, lisinopril-HCTZ 20-25 mg daily  Family / Social history:  -Fhx: none on file -Tobacco use: current every day smoker - 6 cigs/day  O:  Vitals:   03/30/21 1341  BP: (!) 145/93  Pulse: 64   Home BP readings: not checking  Last 3 Office BP readings: BP Readings from Last 3 Encounters:  03/30/21 (!) 145/93  03/23/21 (!) 173/116  02/02/20 115/77    BMET    Component Value Date/Time   NA 141 03/23/2021 1213   K 3.2 (L) 03/23/2021 1213   CL 98 03/23/2021 1213   CO2 24 03/23/2021 1213   GLUCOSE 150 (H) 03/23/2021 1213   GLUCOSE 127 (H) 01/11/2020 2046   BUN 13 03/23/2021 1213   CREATININE 0.83 03/23/2021 1213   CREATININE 0.83 09/22/2016 1356   CALCIUM 9.5 03/23/2021 1213   GFRNONAA 85 05/04/2020 1000   GFRNONAA >89 09/22/2016 1356   GFRAA 98 05/04/2020 1000   GFRAA >89 09/22/2016 1356    Renal function: Estimated Creatinine Clearance: 94.6 mL/min (by C-G formula based on SCr of 0.83 mg/dL).  Clinical ASCVD: No  The 10-year ASCVD risk score Denman George DC Jr., et al., 2013) is: 27.6%   Values used to calculate the score:     Age: 60 years     Sex: Male     Is Non-Hispanic African American: Yes     Diabetic: No      Tobacco smoker: Yes     Systolic Blood Pressure: 145 mmHg     Is BP treated: Yes     HDL Cholesterol: 41 mg/dL     Total Cholesterol: 176 mg/dL  A/P: Hypertension longstanding currently uncontrolled on current medications, however, improved since last visit. BP Goal = <130/80 mmHg. Medication adherence appears fair. Discussed importance of medication adherence and encouraged patient to start checking BP daily at least 1 hour after medications and to bring log to next visit. Patient verbalized understanding. Encouraged patient to aim for a diet full of vegetables, fruit and lean meats (chicken, [place], fish) and to limit salt intake by eating fresh or frozen vegetables (instead of canned), rinse canned vegetables prior to cooking and do not add any additional salt to meals. Encouraged patient to exercise 20-30 minutes daily with the goal of 150 minutes per week. Patient verbalized understanding. -Continued clonidine 0.1 mg TID -Continued hydralazine 50 mg TID -Continued lisinopril-HCTZ 20-25 mg daily -Counseled on lifestyle modifications for blood pressure control including reduced dietary sodium, increased exercise, adequate sleep.  Results reviewed and written information provided. Total time in face-to-face counseling 20 minutes.   F/U Clinic Visit in 3 weeks.    Fabio Neighbors, PharmD, BCPS PGY2 Ambulatory Care Resident [facility]  Pharmacy

## 2021-03-30 ENCOUNTER — Ambulatory Visit: Payer: No Typology Code available for payment source | Attending: Family Medicine | Admitting: Pharmacist

## 2021-03-30 ENCOUNTER — Other Ambulatory Visit: Payer: Self-pay

## 2021-03-30 ENCOUNTER — Encounter: Payer: Self-pay | Admitting: Pharmacist

## 2021-03-30 VITALS — BP 145/93 | HR 64

## 2021-03-30 DIAGNOSIS — I10 Essential (primary) hypertension: Secondary | ICD-10-CM

## 2021-03-31 ENCOUNTER — Encounter: Payer: Self-pay | Admitting: Pharmacist

## 2021-04-19 NOTE — Progress Notes (Signed)
   S:     PCP: Dr. Alvis Lemmings  Patient arrives in good spirits. Presents to the clinic for hypertension evaluation, counseling, and management. Patient was referred and last seen by Primary Care Provider on 03/23/21. Pt last seen by pharmacy on 03/30/21. At that visit, BP was elevated at 145/93 and pt reported non-adherence with HTN medications. Discussed importance of medication adherence, diet, and exercise and pt instructed to start checking BP at home. No medication changes were made.   Today, patient reports he is participating in Ramadan which ends May 2nd and has been taking clonidine and hydralazine twice daily instead of three times daily as prescribed. Reports medication adherence with lisinopril-HCTZ daily. Reports missing 1-2 doses in the last two weeks. Denies dizziness, headaches, blurred vision, and LE swelling. Reports not checking BP at home.   Medication adherence appears fair secondary to Ramadan.  Current BP Medications include: Clonidine 0.1 mg TID, hydralazine 50 mg TID, lisinopril-HCTZ 20-25 mg daily  Family / Social history:  -Fhx: none on file -Tobacco use: current every day smoker - 6 cigs/day  O:  Vitals:   04/20/21 1026  BP: 139/83   Home BP readings: not checking  Last 3 Office BP readings: BP Readings from Last 3 Encounters:  04/20/21 139/83  03/30/21 (!) 145/93  03/23/21 (!) 173/116    BMET    Component Value Date/Time   NA 141 03/23/2021 1213   K 3.2 (L) 03/23/2021 1213   CL 98 03/23/2021 1213   CO2 24 03/23/2021 1213   GLUCOSE 150 (H) 03/23/2021 1213   GLUCOSE 127 (H) 01/11/2020 2046   BUN 13 03/23/2021 1213   CREATININE 0.83 03/23/2021 1213   CREATININE 0.83 09/22/2016 1356   CALCIUM 9.5 03/23/2021 1213   GFRNONAA 85 05/04/2020 1000   GFRNONAA >89 09/22/2016 1356   GFRAA 98 05/04/2020 1000   GFRAA >89 09/22/2016 1356    Renal function: CrCl cannot be calculated (Patient's most recent lab result is older than the maximum 21 days  allowed.).  Clinical ASCVD: No  The 10-year ASCVD risk score Denman George DC Jr., et al., 2013) is: 25.8%   Values used to calculate the score:     Age: 60 years     Sex: Male     Is Non-Hispanic African American: Yes     Diabetic: No     Tobacco smoker: Yes     Systolic Blood Pressure: 139 mmHg     Is BP treated: Yes     HDL Cholesterol: 41 mg/dL     Total Cholesterol: 176 mg/dL   A/P: Hypertension longstanding currently uncontrolled on current medications. BP Goal = <130/80 mmHg. Medication adherence appears fair as patient is participating in Ramadan. Discussed importance of medication adherence and to closely monitor BP. Patient verbalized understanding.  -No medication changes today -Continued clonidine 0.1 mg TID -Continued hydralazine 50 mg TID -Continued lisinopril-HCTZ 20-25 mg daily -Counseled on lifestyle modifications for blood pressure control including reduced dietary sodium, increased exercise, adequate sleep.  Results reviewed and written information provided. Total time in face-to-face counseling 20 minutes.   F/U Clinic Visit in ~4 weeks.    Fabio Neighbors, PharmD, BCPS PGY2 Ambulatory Care Resident [facility]  Pharmacy

## 2021-04-20 ENCOUNTER — Other Ambulatory Visit: Payer: Self-pay

## 2021-04-20 ENCOUNTER — Encounter: Payer: Self-pay | Admitting: Pharmacist

## 2021-04-20 ENCOUNTER — Ambulatory Visit: Payer: BC Managed Care – PPO | Attending: Family Medicine | Admitting: Pharmacist

## 2021-04-20 VITALS — BP 139/83

## 2021-04-20 DIAGNOSIS — I10 Essential (primary) hypertension: Secondary | ICD-10-CM | POA: Diagnosis not present

## 2021-05-11 ENCOUNTER — Other Ambulatory Visit: Payer: Self-pay | Admitting: Family Medicine

## 2021-05-11 DIAGNOSIS — K219 Gastro-esophageal reflux disease without esophagitis: Secondary | ICD-10-CM

## 2021-05-18 ENCOUNTER — Other Ambulatory Visit: Payer: Self-pay

## 2021-05-18 ENCOUNTER — Ambulatory Visit: Payer: BC Managed Care – PPO | Attending: Family Medicine | Admitting: Pharmacist

## 2021-05-18 VITALS — BP 154/88

## 2021-05-18 DIAGNOSIS — I10 Essential (primary) hypertension: Secondary | ICD-10-CM

## 2021-05-18 MED ORDER — AMLODIPINE BESYLATE 5 MG PO TABS: 5.0000 mg | ORAL_TABLET | Freq: Every day | ORAL | 0 refills | Status: DC

## 2021-05-18 NOTE — Progress Notes (Signed)
   S:     PCP: Dr. Alvis Lemmings  Patient arrives in good spirits. Presents to the clinic for hypertension evaluation, counseling, and management. Patient was referred and last seen by Primary Care Provider on 03/23/21. \Pharmacy has seen him before but we have not recommended any medication changes.   Medication adherence appears fair. Upon further discussion, pt commonly misses hydralazine and clonidine d/t sleep schedule. He was able to take both this morning but he admits to several missed doses per week. He wonders if there is a once daily medication he can add to better control his BP.   Of note, he denies chest pains, dyspnea, HA, blurred vision.   Current BP Medications include: Clonidine 0.1 mg TID, hydralazine 50 mg TID, lisinopril-HCTZ 20-25 mg daily  Family / Social history:  -Fhx: none on file -Tobacco use: current every day smoker - 6 cigs/day  O:  Vitals:   05/18/21 1011  BP: (!) 154/88   Home BP readings: not checking  Last 3 Office BP readings: BP Readings from Last 3 Encounters:  05/18/21 (!) 154/88  04/20/21 139/83  03/30/21 (!) 145/93    BMET    Component Value Date/Time   NA 141 03/23/2021 1213   K 3.2 (L) 03/23/2021 1213   CL 98 03/23/2021 1213   CO2 24 03/23/2021 1213   GLUCOSE 150 (H) 03/23/2021 1213   GLUCOSE 127 (H) 01/11/2020 2046   BUN 13 03/23/2021 1213   CREATININE 0.83 03/23/2021 1213   CREATININE 0.83 09/22/2016 1356   CALCIUM 9.5 03/23/2021 1213   GFRNONAA 85 05/04/2020 1000   GFRNONAA >89 09/22/2016 1356   GFRAA 98 05/04/2020 1000   GFRAA >89 09/22/2016 1356    Renal function: CrCl cannot be calculated (Patient's most recent lab result is older than the maximum 21 days allowed.).  Clinical ASCVD: No  The 10-year ASCVD risk score Denman George DC Jr., et al., 2013) is: 30.5%   Values used to calculate the score:     Age: 60 years     Sex: Male     Is Non-Hispanic African American: Yes     Diabetic: No     Tobacco smoker: Yes      Systolic Blood Pressure: 154 mmHg     Is BP treated: Yes     HDL Cholesterol: 41 mg/dL     Total Cholesterol: 176 mg/dL   A/P: Hypertension longstanding currently uncontrolled on current medications. BP Goal = <130/80 mmHg. Medication adherence needs to improve. Discussed importance of medication adherence and to closely monitor BP. Patient verbalized understanding. I recommend to start low-dose amlodipine today. We will see him in 1-2 weeks to ensure he is not hypotensive but that his BP is improving. Additionally, we discussing S/sx of hypotension and to contact us if these occur.  -Add amlodipine 5 mg daily.  -Continued clonidine 0.1 mg TID -Continued hydralazine 50 mg TID -Continued lisinopril-HCTZ 20-25 mg daily -Counseled on lifestyle modifications for blood pressure control including reduced dietary sodium, increased exercise, adequate sleep.  Results reviewed and written information provided. Total time in face-to-face counseling 20 minutes.   F/U Clinic Visit in ~1-2 weeks.    Butch Penny, PharmD, Patsy [place], CPP Clinical Pharmacist [facility] [phone]

## 2021-06-02 ENCOUNTER — Other Ambulatory Visit: Payer: Self-pay

## 2021-06-02 ENCOUNTER — Encounter: Payer: Self-pay | Admitting: Pharmacist

## 2021-06-02 ENCOUNTER — Ambulatory Visit: Payer: BC Managed Care – PPO | Attending: Family Medicine | Admitting: Pharmacist

## 2021-06-02 VITALS — BP 122/75

## 2021-06-02 DIAGNOSIS — I10 Essential (primary) hypertension: Secondary | ICD-10-CM | POA: Diagnosis not present

## 2021-06-02 NOTE — Progress Notes (Addendum)
   S:     PCP: Dr. Alvis Lemmings   Patient arrives in good spirits. Presents to the clinic for hypertension evaluation, counseling, and management. Patient was referred and last seen by Primary Care Provider on 03/23/21. Pharmacy last saw him on 05/18/2021 and added amlodipine.   Medication adherence appears to be improving. He still misses the occasional clonidine and hydralazine dose, but he confirms good adherence to once daily amlodipine and combo Zestoretic.   Of note, he denies chest pains, dyspnea, HA, blurred vision. Denies any side effects to the amlodipine.   Current BP Medications include: amlodipine 5 mg daily, Clonidine 0.1 mg TID, hydralazine 50 mg TID, lisinopril-HCTZ 20-25 mg daily   Family / Social history:  -Fhx: none on file -Tobacco use: current every day smoker - 6 cigs/day   O:  Vitals:   06/02/21 0912  BP: 122/75    Home BP readings: not checking  Last 3 Office BP readings: BP Readings from Last 3 Encounters:  06/02/21 122/75  05/18/21 (!) 154/88  04/20/21 139/83    BMET    Component Value Date/Time   NA 141 03/23/2021 1213   K 3.2 (L) 03/23/2021 1213   CL 98 03/23/2021 1213   CO2 24 03/23/2021 1213   GLUCOSE 150 (H) 03/23/2021 1213   GLUCOSE 127 (H) 01/11/2020 2046   BUN 13 03/23/2021 1213   CREATININE 0.83 03/23/2021 1213   CREATININE 0.83 09/22/2016 1356   CALCIUM 9.5 03/23/2021 1213   GFRNONAA 85 05/04/2020 1000   GFRNONAA >89 09/22/2016 1356   GFRAA 98 05/04/2020 1000   GFRAA >89 09/22/2016 1356    Renal function: CrCl cannot be calculated (Patient's most recent lab result is older than the maximum 21 days allowed.).  Clinical ASCVD: No  The 10-year ASCVD risk score Denman George DC Jr., et al., 2013) is: 20.7%   Values used to calculate the score:     Age: 60 years     Sex: Male     Is Non-Hispanic African American: Yes     Diabetic: No     Tobacco smoker: Yes     Systolic Blood Pressure: 122 mmHg     Is BP treated: Yes     HDL Cholesterol:  41 mg/dL     Total Cholesterol: 176 mg/dL   A/P: Hypertension longstanding currently at goal on current medications. BP Goal = <130/80 mmHg. Medication adherence improving. -Continued amlodipine 5 mg daily.  -Continued clonidine 0.1 mg TID -Continued hydralazine 50 mg TID -Continued lisinopril-HCTZ 20-25 mg daily -Counseled on lifestyle modifications for blood pressure control including reduced dietary sodium, increased exercise, adequate sleep.  Results reviewed and written information provided. Total time in face-to-face counseling 20 minutes.   F/U Clinic Visit in 1 month.   Butch Penny, PharmD, Patsy [place], CPP Clinical Pharmacist [facility] [phone]

## 2021-06-11 ENCOUNTER — Other Ambulatory Visit: Payer: Self-pay | Admitting: Family Medicine

## 2021-06-11 DIAGNOSIS — K219 Gastro-esophageal reflux disease without esophagitis: Secondary | ICD-10-CM

## 2021-06-11 DIAGNOSIS — I10 Essential (primary) hypertension: Secondary | ICD-10-CM

## 2021-06-11 NOTE — Telephone Encounter (Signed)
Requested medication (s) are due for refill today: yes  Requested medication (s) are on the active medication list: yes  Last refill:  03/23/21  Future visit scheduled: no  Notes to clinic:  overdue labs    Requested Prescriptions  Pending Prescriptions Disp Refills   hydrALAZINE (APRESOLINE) 50 MG tablet [Pharmacy Med Name: hydrALAZINE HCl 50 MG Oral Tablet] 90 tablet     Sig: TAKE 1 TABLET BY MOUTH THREE TIMES DAILY      Cardiovascular:  Vasodilators Failed - 06/11/2021  1:03 PM      Failed - HCT in normal range and within 360 days    HCT  Date Value Ref Range Status  01/12/2020 43.1 39.0 - 52.0 % Final          Failed - HGB in normal range and within 360 days    Hemoglobin  Date Value Ref Range Status  01/12/2020 14.3 13.0 - 17.0 g/dL Final          Failed - RBC in normal range and within 360 days    RBC  Date Value Ref Range Status  01/12/2020 5.13 4.22 - 5.81 MIL/uL Final          Failed - WBC in normal range and within 360 days    WBC  Date Value Ref Range Status  01/12/2020 7.6 4.0 - 10.5 K/uL Final          Failed - PLT in normal range and within 360 days    Platelets  Date Value Ref Range Status  01/12/2020 169 150 - 400 K/uL Final          Passed - Last BP in normal range    BP Readings from Last 1 Encounters:  06/02/21 122/75          Passed - Valid encounter within last 12 months    Recent Outpatient Visits           1 week ago Essential hypertension   [facility] Lois Huxley, Cornelius Moras, RPH-CPP   3 weeks ago Essential hypertension   [facility] Drucilla Chalet, RPH-CPP   1 month ago Essential hypertension   [facility] Drucilla Chalet, RPH-CPP   2 months ago Essential hypertension   [facility] Drucilla Chalet, RPH-CPP   2 months ago Screening for colon cancer   [facility] [place], Odette Horns, MD       Future Appointments             In 2 weeks Drucilla Chalet, RPH-CPP [facility]              Signed Prescriptions Disp Refills   famotidine (PEPCID) 20 MG tablet 90 tablet 0    Sig: Take 1 tablet by mouth once daily      Gastroenterology:  H2 Antagonists Passed - 06/11/2021  1:03 PM      Passed - Valid encounter within last 12 months    Recent Outpatient Visits           1 week ago Essential hypertension   [facility] Lois Huxley, Cornelius Moras, RPH-CPP   3 weeks ago Essential hypertension   [facility] Lois Huxley, Cornelius Moras, RPH-CPP   1 month ago Essential hypertension   [facility] Lois Huxley, Cornelius Moras, RPH-CPP   2 months ago Essential hypertension   [facility] Lois Huxley, Cornelius Moras, RPH-CPP   2 months ago Screening for colon cancer   [facility] Hoy Register, MD       Future Appointments             In 2 weeks Lois Huxley, Cornelius Moras, RPH-CPP [facility]

## 2021-06-11 NOTE — Telephone Encounter (Signed)
Requested Prescriptions  Pending Prescriptions Disp Refills  . hydrALAZINE (APRESOLINE) 50 MG tablet [Pharmacy Med Name: hydrALAZINE HCl 50 MG Oral Tablet] 90 tablet     Sig: TAKE 1 TABLET BY MOUTH THREE TIMES DAILY     Cardiovascular:  Vasodilators Failed - 06/11/2021  1:03 PM      Failed - HCT in normal range and within 360 days    HCT  Date Value Ref Range Status  01/12/2020 43.1 39.0 - 52.0 % Final         Failed - HGB in normal range and within 360 days    Hemoglobin  Date Value Ref Range Status  01/12/2020 14.3 13.0 - 17.0 g/dL Final         Failed - RBC in normal range and within 360 days    RBC  Date Value Ref Range Status  01/12/2020 5.13 4.22 - 5.81 MIL/uL Final         Failed - WBC in normal range and within 360 days    WBC  Date Value Ref Range Status  01/12/2020 7.6 4.0 - 10.5 K/uL Final         Failed - PLT in normal range and within 360 days    Platelets  Date Value Ref Range Status  01/12/2020 169 150 - 400 K/uL Final         Passed - Last BP in normal range    BP Readings from Last 1 Encounters:  06/02/21 122/75         Passed - Valid encounter within last 12 months    Recent Outpatient Visits          1 week ago Essential hypertension   [facility] Lois Huxley, Cornelius Moras, RPH-CPP   3 weeks ago Essential hypertension   [facility] Lois Huxley, Cornelius Moras, RPH-CPP   1 month ago Essential hypertension   [facility] Drucilla Chalet, RPH-CPP   2 months ago Essential hypertension   [facility] Lois Huxley, Cornelius Moras, RPH-CPP   2 months ago Screening for colon cancer   [facility] Hoy Register, MD      Future Appointments            In 2 weeks Lois Huxley, Cornelius Moras, RPH-CPP [facility]           . famotidine (PEPCID) 20 MG tablet [Pharmacy Med Name:  Famotidine 20 MG Oral Tablet] 90 tablet 0    Sig: Take 1 tablet by mouth once daily     Gastroenterology:  H2 Antagonists Passed - 06/11/2021  1:03 PM      Passed - Valid encounter within last 12 months    Recent Outpatient Visits          1 week ago Essential hypertension   [facility] Lois Huxley, Cornelius Moras, RPH-CPP   3 weeks ago Essential hypertension   [facility] Lois Huxley, Cornelius Moras, RPH-CPP   1 month ago Essential hypertension   [facility] Drucilla Chalet, RPH-CPP   2 months ago Essential hypertension   [facility] Drucilla Chalet, RPH-CPP   2 months ago Screening for colon cancer   [facility] Hoy Register, MD  Future Appointments            In 2 weeks Lois Huxley, Cornelius Moras, RPH-CPP [facility]

## 2021-06-30 ENCOUNTER — Other Ambulatory Visit: Payer: Self-pay

## 2021-06-30 ENCOUNTER — Ambulatory Visit: Payer: BC Managed Care – PPO | Attending: Family Medicine | Admitting: Pharmacist

## 2021-06-30 ENCOUNTER — Encounter: Payer: Self-pay | Admitting: Pharmacist

## 2021-06-30 VITALS — BP 122/86

## 2021-06-30 DIAGNOSIS — I10 Essential (primary) hypertension: Secondary | ICD-10-CM

## 2021-06-30 NOTE — Progress Notes (Signed)
   S:     PCP: Dr. Alvis Lemmings   Patient arrives in good spirits. Presents to the clinic for hypertension evaluation, counseling, and management. Patient was referred and last seen by Primary Care Provider on 03/23/21. Pharmacy last saw him on 06/02/2021 and his BP was good.  Medication adherence appears to be improving. He still misses the occasional clonidine and hydralazine dose, but he confirms good adherence to once daily amlodipine and combo Zestoretic.   Of note, he denies chest pains, dyspnea, HA, blurred vision. Denies any side effects to the amlodipine.   Current BP Medications include: amlodipine 5 mg daily, Clonidine 0.1 mg TID, hydralazine 50 mg TID, lisinopril-HCTZ 20-25 mg daily   Family / Social history:  -Fhx: none on file -Tobacco use: current every day smoker - 6 cigs/day   O:  Vitals:   06/30/21 0854  BP: 122/86    Home BP readings: not checking  Last 3 Office BP readings: BP Readings from Last 3 Encounters:  06/30/21 122/86  06/02/21 122/75  05/18/21 (!) 154/88    BMET    Component Value Date/Time   NA 141 03/23/2021 1213   K 3.2 (L) 03/23/2021 1213   CL 98 03/23/2021 1213   CO2 24 03/23/2021 1213   GLUCOSE 150 (H) 03/23/2021 1213   GLUCOSE 127 (H) 01/11/2020 2046   BUN 13 03/23/2021 1213   CREATININE 0.83 03/23/2021 1213   CREATININE 0.83 09/22/2016 1356   CALCIUM 9.5 03/23/2021 1213   GFRNONAA 85 05/04/2020 1000   GFRNONAA >89 09/22/2016 1356   GFRAA 98 05/04/2020 1000   GFRAA >89 09/22/2016 1356    Renal function: CrCl cannot be calculated (Patient's most recent lab result is older than the maximum 21 days allowed.).  Clinical ASCVD: No  The 10-year ASCVD risk score Denman George DC Jr., et al., 2013) is: 20.7%   Values used to calculate the score:     Age: 34 years     Sex: Male     Is Non-Hispanic African American: Yes     Diabetic: No     Tobacco smoker: Yes     Systolic Blood Pressure: 122 mmHg     Is BP treated: Yes     HDL Cholesterol: 41  mg/dL     Total Cholesterol: 176 mg/dL   A/P: Hypertension longstanding currently at goal on current medications. BP Goal = <130/80 mmHg. Medication adherence improving. His BP is improving since addition of amlodipine and with improved adherence overall. Recommend he continue his current regimen and f/u with his PCP in September.  -Continued amlodipine 5 mg daily.  -Continued clonidine 0.1 mg TID -Continued hydralazine 50 mg TID -Continued lisinopril-HCTZ 20-25 mg daily -Counseled on lifestyle modifications for blood pressure control including reduced dietary sodium, increased exercise, adequate sleep.  Results reviewed and written information provided. Total time in face-to-face counseling 20 minutes.   F/U Clinic Visit with PCP in September.   Butch Penny, PharmD, Patsy [place], CPP Clinical Pharmacist [facility] [phone]

## 2021-07-12 DIAGNOSIS — H5203 Hypermetropia, bilateral: Secondary | ICD-10-CM | POA: Diagnosis not present

## 2021-09-05 ENCOUNTER — Ambulatory Visit: Payer: BC Managed Care – PPO | Admitting: Family Medicine

## 2021-09-26 ENCOUNTER — Other Ambulatory Visit: Payer: Self-pay | Admitting: Family Medicine

## 2021-09-26 DIAGNOSIS — K219 Gastro-esophageal reflux disease without esophagitis: Secondary | ICD-10-CM

## 2021-09-26 DIAGNOSIS — I10 Essential (primary) hypertension: Secondary | ICD-10-CM

## 2021-09-27 NOTE — Telephone Encounter (Signed)
Requested medication (s) are due for refill today: lisinopril: yes      hydralazine: no  Requested medication (s) are on the active medication list: yes  Last refill:  lisnopril/HCTZ:         hydralazine: 06/13/21  Future visit scheduled: no  Notes to clinic:  overdue lab work lisinopril/HCTZ     Hydralazine: too soon   Requested Prescriptions  Pending Prescriptions Disp Refills   lisinopril-hydrochlorothiazide (ZESTORETIC) 20-25 MG tablet [Pharmacy Med Name: Lisinopril-hydroCHLOROthiazide 20-25 MG Oral Tablet] 90 tablet     Sig: Take 1 tablet by mouth once daily     Cardiovascular:  ACEI + Diuretic Combos Failed - 09/26/2021  4:33 PM      Failed - Na in normal range and within 180 days    Sodium  Date Value Ref Range Status  03/23/2021 141 134 - 144 mmol/L Final          Failed - K in normal range and within 180 days    Potassium  Date Value Ref Range Status  03/23/2021 3.2 (L) 3.5 - 5.2 mmol/L Final          Failed - Cr in normal range and within 180 days    Creat  Date Value Ref Range Status  09/22/2016 0.83 0.70 - 1.33 mg/dL Final    Comment:      For patients > or = 60 years of age: The upper reference limit for Creatinine is approximately 13% higher for people identified as African-American.      Creatinine, Ser  Date Value Ref Range Status  03/23/2021 0.83 0.76 - 1.27 mg/dL Final          Failed - Ca in normal range and within 180 days    Calcium  Date Value Ref Range Status  03/23/2021 9.5 8.6 - 10.2 mg/dL Final          Passed - Patient is not pregnant      Passed - Last BP in normal range    BP Readings from Last 1 Encounters:  06/30/21 122/86          Passed - Valid encounter within last 6 months    Recent Outpatient Visits           2 months ago Essential hypertension   [facility] [place], Cornelius Moras, RPH-CPP   3 months ago Essential hypertension   [facility] Drucilla Chalet, RPH-CPP   4 months ago Essential hypertension   [facility] Drucilla Chalet, RPH-CPP   5 months ago Essential hypertension   [facility] [place], Cornelius Moras, RPH-CPP   6 months ago Essential hypertension   [facility] [place], Jeannett Senior L, RPH-CPP               hydrALAZINE (APRESOLINE) 50 MG tablet [Pharmacy Med Name: hydrALAZINE HCl 50 MG Oral Tablet] 90 tablet     Sig: TAKE 1 TABLET BY MOUTH THREE TIMES DAILY     Cardiovascular:  Vasodilators Failed - 09/26/2021  4:33 PM      Failed - HCT in normal range and within 360 days    HCT  Date Value Ref Range Status  01/12/2020 43.1 39.0 - 52.0 % Final          Failed - HGB in normal range and within 360 days    Hemoglobin  Date Value Ref Range Status  01/12/2020 14.3 13.0 - 17.0 g/dL Final          Failed - RBC in normal range and within 360 days    RBC  Date Value Ref Range Status  01/12/2020 5.13 4.22 - 5.81 MIL/uL Final          Failed - WBC in normal range and within 360 days    WBC  Date Value Ref Range Status  01/12/2020 7.6 4.0 - 10.5 K/uL Final          Failed - PLT in normal range and within 360 days    Platelets  Date Value Ref Range Status  01/12/2020 169 150 - 400 K/uL Final          Passed - Last BP in normal range    BP Readings from Last 1 Encounters:  06/30/21 122/86          Passed - Valid encounter within last 12 months    Recent Outpatient Visits           2 months ago Essential hypertension   [facility] [place], Cornelius Moras, RPH-CPP   3 months ago Essential hypertension   [facility] [place], Cornelius Moras, RPH-CPP   4 months ago Essential hypertension   [facility] [place], Cornelius Moras, RPH-CPP   5 months ago Essential hypertension   [facility] [place], Jeannett Senior L, RPH-CPP   6 months ago Essential hypertension   [facility] [place], Cornelius Moras, RPH-CPP              Signed Prescriptions Disp Refills   famotidine (PEPCID) 20 MG tablet 90 tablet 0    Sig: Take 1 tablet by mouth once daily     Gastroenterology:  H2 Antagonists Passed - 09/26/2021  4:33 PM      Passed - Valid encounter within last 12 months    Recent Outpatient Visits           2 months ago Essential hypertension   [facility] [place], Cornelius Moras, RPH-CPP   3 months ago Essential hypertension   [place] [facility] [place], Cornelius Moras, RPH-CPP   4 months ago Essential hypertension   [facility] [place], Jeannett Senior L, RPH-CPP   5 months ago Essential hypertension   [facility] [employer], Cornelius Moras, RPH-CPP   6 months ago Essential hypertension    [facility] [place], Stephen L, RPH-CPP               cloNIDine (CATAPRES) 0.1 MG tablet 90 tablet 0    Sig: TAKE 1 TABLET BY MOUTH THREE TIMES DAILY     Cardiovascular:  Alpha-2 Agonists Passed - 09/26/2021  4:33 PM      Passed - Last BP in normal range    BP Readings from Last 1 Encounters:  06/30/21 122/86          Passed - Last Heart Rate in normal range    Pulse Readings from Last 1 Encounters:  03/30/21 64          Passed - Valid encounter within last 6 months    Recent Outpatient Visits           2 months ago  Essential hypertension   [facility] [place], Cornelius Moras, RPH-CPP   3 months ago Essential hypertension   [facility] [place], Cornelius Moras, RPH-CPP   4 months ago Essential hypertension   [facility] [place], Cornelius Moras, RPH-CPP   5 months ago Essential hypertension   [facility] [place], Cornelius Moras, RPH-CPP   6 months ago Essential hypertension   [facility] [place], Cornelius Moras, RPH-CPP

## 2021-10-24 ENCOUNTER — Ambulatory Visit: Payer: BC Managed Care – PPO | Attending: Physician Assistant | Admitting: Physician Assistant

## 2021-10-24 ENCOUNTER — Other Ambulatory Visit: Payer: Self-pay

## 2021-10-24 ENCOUNTER — Encounter: Payer: Self-pay | Admitting: Physician Assistant

## 2021-10-24 VITALS — BP 154/81 | HR 83 | Temp 98.7°F | Resp 18 | Ht 69.0 in | Wt 171.0 lb

## 2021-10-24 DIAGNOSIS — R7303 Prediabetes: Secondary | ICD-10-CM

## 2021-10-24 DIAGNOSIS — R748 Abnormal levels of other serum enzymes: Secondary | ICD-10-CM

## 2021-10-24 DIAGNOSIS — E876 Hypokalemia: Secondary | ICD-10-CM

## 2021-10-24 DIAGNOSIS — Z13220 Encounter for screening for lipoid disorders: Secondary | ICD-10-CM

## 2021-10-24 DIAGNOSIS — Z9189 Other specified personal risk factors, not elsewhere classified: Secondary | ICD-10-CM

## 2021-10-24 DIAGNOSIS — I10 Essential (primary) hypertension: Secondary | ICD-10-CM | POA: Diagnosis not present

## 2021-10-24 DIAGNOSIS — R7309 Other abnormal glucose: Secondary | ICD-10-CM

## 2021-10-24 DIAGNOSIS — K219 Gastro-esophageal reflux disease without esophagitis: Secondary | ICD-10-CM | POA: Diagnosis not present

## 2021-10-24 DIAGNOSIS — Z720 Tobacco use: Secondary | ICD-10-CM

## 2021-10-24 DIAGNOSIS — Z6825 Body mass index (BMI) 25.0-25.9, adult: Secondary | ICD-10-CM

## 2021-10-24 DIAGNOSIS — Z125 Encounter for screening for malignant neoplasm of prostate: Secondary | ICD-10-CM

## 2021-10-24 MED ORDER — HYDRALAZINE HCL 50 MG PO TABS: 50.0000 mg | ORAL_TABLET | Freq: Three times a day (TID) | ORAL | 1 refills | Status: DC

## 2021-10-24 MED ORDER — FAMOTIDINE 20 MG PO TABS: 20.0000 mg | ORAL_TABLET | Freq: Every day | ORAL | 1 refills | Status: DC

## 2021-10-24 MED ORDER — LISINOPRIL-HYDROCHLOROTHIAZIDE 20-25 MG PO TABS: 1.0000 | ORAL_TABLET | Freq: Every day | ORAL | 1 refills | Status: DC

## 2021-10-24 MED ORDER — CLONIDINE HCL 0.1 MG PO TABS: 0.1000 mg | ORAL_TABLET | Freq: Three times a day (TID) | ORAL | 1 refills | Status: DC

## 2021-10-24 NOTE — Patient Instructions (Signed)
I encourage you to start checking your blood pressure at home on a daily basis, keep a written log and have available for all office visits.  Please notify the office of any elevated readings.  Your A1c is 6.1.  This is considered prediabetes.  I do encourage you to work on eliminating sugary drinks from your diet.  Please return for fasting labs, we will call you with the results when they are available.  Roney Jaffe, PA-C Physician Assistant [facility] [email or web address]    Prediabetes Prediabetes is when your blood sugar (blood glucose) level is higher than normal but not high enough for you to be diagnosed with type 2 diabetes. Having prediabetes puts you at risk for developing type 2 diabetes (type 2 diabetes mellitus). With certain lifestyle changes, you may be able to prevent or delay the onset of type 2 diabetes. This is important because type 2 diabetes can lead to serious complications, such as: Heart disease. Stroke. Blindness. Kidney disease. Depression. Poor circulation in the feet and legs. In severe cases, this could lead to surgical removal of a leg (amputation). What are the causes? The exact cause of prediabetes is not known. It may result from insulin resistance. Insulin resistance develops when cells in the body do not respond properly to insulin that the body makes. This can cause excess glucose to build up in the blood. High blood glucose (hyperglycemia) can develop. What increases the risk? The following factors may make you more likely to develop this condition: You have a family member with type 2 diabetes. You are older than 45 years. You had a temporary form of diabetes during a pregnancy (gestational diabetes). You had polycystic ovary syndrome (PCOS). You are overweight or obese. You are inactive (sedentary). You have a history of heart disease, including problems with cholesterol levels, high  levels of blood fats, or high blood pressure. What are the signs or symptoms? You may have no symptoms. If you do have symptoms, they may include: Increased hunger. Increased thirst. Increased urination. Vision changes, such as blurry vision. Tiredness (fatigue). How is this diagnosed? This condition can be diagnosed with blood tests. Your blood glucose may be checked with one or more of the following tests: A fasting blood glucose (FBG) test. You will not be allowed to eat (you will fast) for at least 8 hours before a blood sample is taken. An A1C blood test (hemoglobin A1C). This test provides information about blood glucose levels over the previous 2?3 months. An oral glucose tolerance test (OGTT). This test measures your blood glucose at two points in time: After fasting. This is your baseline level. Two hours after you drink a beverage that contains glucose. You may be diagnosed with prediabetes if: Your FBG is 100?125 mg/dL (5.4-4.9 mmol/L). Your A1C level is 5.7?6.4% (39-46 mmol/mol). Your OGTT result is 140?199 mg/dL (2.0-10 mmol/L). These blood tests may be repeated to confirm your diagnosis. How is this treated? Treatment may include dietary and lifestyle changes to help lower your blood glucose and prevent type 2 diabetes from developing. In some cases, medicine may be prescribed to help lower the risk of type 2 diabetes. Follow these instructions at home: Nutrition  Follow a healthy meal plan. This includes eating lean proteins, whole grains, legumes, fresh fruits and vegetables, low-fat dairy products, and healthy fats. Follow instructions from your health care provider about eating or drinking restrictions. Meet with a dietitian to create a healthy eating plan that is right for you.  Lifestyle Do moderate-intensity exercise for at least 30 minutes a day on 5 or more days each week, or as told by your health care provider. A mix of activities may be best, such as: Brisk  walking, swimming, biking, and weight lifting. Lose weight as told by your health care provider. Losing 5-7% of your body weight can reverse insulin resistance. Do not drink alcohol if: Your health care provider tells you not to drink. You are pregnant, may be pregnant, or are planning to become pregnant. If you drink alcohol: Limit how much you use to: 0-1 drink a day for women. 0-2 drinks a day for men. Be aware of how much alcohol is in your drink. In the [place] one drink equals one 12 oz bottle of beer (355 mL), one 5 oz glass of wine (148 mL), or one 1 oz glass of hard liquor (44 mL). General instructions Take over-the-counter and prescription medicines only as told by your health care provider. You may be prescribed medicines that help lower the risk of type 2 diabetes. Do not use any products that contain nicotine or tobacco, such as cigarettes, e-cigarettes, and chewing tobacco. If you need help quitting, ask your health care provider. Keep all follow-up visits. This is important. Where to find more information American Diabetes Association: www.diabetes.org Academy of Nutrition and Dietetics: www.eatright.org American Heart Association: www.heart.org Contact a health care provider if: You have any of these symptoms: Increased hunger. Increased urination. Increased thirst. Fatigue. Vision changes, such as blurry vision. Get help right away if you: Have shortness of breath. Feel confused. Vomit or feel like you may vomit. Summary Prediabetes is when your blood sugar (blood glucose)level is higher than normal but not high enough for you to be diagnosed with type 2 diabetes. Having prediabetes puts you at risk for developing type 2 diabetes (type 2 diabetes mellitus). Make lifestyle changes such as eating a healthy diet and exercising regularly to help prevent diabetes. Lose weight as told by your health care provider. This information is not intended to replace advice given to  you by your health care provider. Make sure you discuss any questions you have with your health care provider. Document Revised: 03/11/2020 Document Reviewed: 03/11/2020 Elsevier Patient Education  2022 Elsevier Inc.    How to Take Your Blood Pressure Blood pressure is a measurement of how strongly your blood is pressing against the walls of your arteries. Arteries are blood vessels that carry blood from your heart throughout your body. Your health care provider takes your blood pressure at each office visit. You can also take your own blood pressure at home with a blood pressure monitor. You may need to take your own blood pressure to: Confirm a diagnosis of high blood pressure (hypertension). Monitor your blood pressure over time. Make sure your blood pressure medicine is working. Supplies needed: Blood pressure monitor. Dining room chair to sit in. Table or desk. Small notebook and pencil or pen. How to prepare To get the most accurate reading, avoid the following for 30 minutes before you check your blood pressure: Drinking caffeine. Drinking alcohol. Eating. Smoking. Exercising. Five minutes before you check your blood pressure: Use the bathroom and urinate so that you have an empty bladder. Sit quietly in a dining room chair. Do not sit in a soft couch or an armchair. Do not talk. How to take your blood pressure To check your blood pressure, follow the instructions in the manual that came with your blood pressure monitor. If you  have a digital blood pressure monitor, the instructions may be as follows: Sit up straight in a chair. Place your feet on the floor. Do not cross your ankles or legs. Rest your left arm at the level of your heart on a table or desk or on the arm of a chair. Pull up your shirt sleeve. Wrap the blood pressure cuff around the upper part of your left arm, 1 inch (2.5 cm) above your elbow. It is best to wrap the cuff around bare skin. Fit the cuff snugly  around your arm. You should be able to place only one finger between the cuff and your arm. Position the cord so that it rests in the bend of your elbow. Press the power button. Sit quietly while the cuff inflates and deflates. Read the digital reading on the monitor screen and write the numbers down (record them) in a notebook. Wait 2-3 minutes, then repeat the steps, starting at step 1. What does my blood pressure reading mean? A blood pressure reading consists of a higher number over a lower number. Ideally, your blood pressure should be below 120/80. The first ("top") number is called the systolic pressure. It is a measure of the pressure in your arteries as your heart beats. The second ("bottom") number is called the diastolic pressure. It is a measure of the pressure in your arteries as the heart relaxes. Blood pressure is classified into five stages. The following are the stages for adults who do not have a short-term serious illness or a chronic condition. Systolic pressure and diastolic pressure are measured in a unit called mm Hg (millimeters of mercury).  Normal Systolic pressure: below 120. Diastolic pressure: below 80. Elevated Systolic pressure: 120-129. Diastolic pressure: below 80. Hypertension stage 1 Systolic pressure: 130-139. Diastolic pressure: 80-89. Hypertension stage 2 Systolic pressure: 140 or above. Diastolic pressure: 90 or above. You can have elevated blood pressure or hypertension even if only the systolic or only the diastolic number in your reading is higher than normal. Follow these instructions at home: Medicines Take over-the-counter and prescription medicines only as told by your health care provider. Tell your health care provider if you are having any side effects from blood pressure medicine. General instructions Check your blood pressure as often as recommended by your health care provider. Check your blood pressure at the same time every day. Take  your monitor to the next appointment with your health care provider to make sure that: You are using it correctly. It provides accurate readings. Understand what your goal blood pressure numbers are. Keep all follow-up visits as told by your health care provider. This is important. General tips Your health care provider can suggest a reliable monitor that will meet your needs. There are several types of home blood pressure monitors. Choose a monitor that has an arm cuff. Do not choose a monitor that measures your blood pressure from your wrist or finger. Choose a cuff that wraps snugly around your upper arm. You should be able to fit only one finger between your arm and the cuff. You can buy a blood pressure monitor at most drugstores or online. Where to find more information American Heart Association: www.heart.org Contact a health care provider if: Your blood pressure is consistently high. Your blood pressure is suddenly low. Get help right away if: Your systolic blood pressure is higher than 180. Your diastolic blood pressure is higher than 120. Summary Blood pressure is a measurement of how strongly your blood is pressing  against the walls of your arteries. A blood pressure reading consists of a higher number over a lower number. Ideally, your blood pressure should be below 120/80. Check your blood pressure at the same time every day. Avoid caffeine, alcohol, smoking, and exercise for 30 minutes prior to checking your blood pressure. These agents can affect the accuracy of the blood pressure reading. This information is not intended to replace advice given to you by your health care provider. Make sure you discuss any questions you have with your health care provider. Document Revised: 10/20/2020 Document Reviewed: 12/05/2019 Elsevier Patient Education  2022 [employer].

## 2021-10-24 NOTE — Progress Notes (Signed)
Established Patient Office Visit  Subjective:  Patient ID: Frank Warren, male    DOB: 1961/01/31  Age: 60 y.o. MRN: [id number]  CC:  Chief Complaint  Patient presents with   Medication Refill    HTN    HPI [name] presents for medication refills.  Reports that he has been out of his lisinopril-HCTZ for the past 3 days.  Reports that he stopped amlodipine on his own, states that he felt it was "affecting his manhood".  Reports that he stopped this 1 month ago.  Reports that he did have an improvement in his erections after stopping the amlodipine.  States that he has been taking the clonidine and hydralazine 3 times a day as directed.  Reports he does not check his blood pressure at home.  Reports that he stopped taking the potassium, did not think he needed it anymore.    Past Medical History:  Diagnosis Date   Acid reflux disease    Dyslipidemia    Hyperlipidemia    Hypertension    Tobacco abuse     History reviewed. No pertinent surgical history.  History reviewed. No pertinent family history.  Social History   Socioeconomic History   Marital status: Married    Spouse name: Not on file   Number of children: Not on file   Years of education: Not on file   Highest education level: Not on file  Occupational History   Not on file  Tobacco Use   Smoking status: Every Day    Packs/day: 0.50    Types: Cigarettes   Smokeless tobacco: Never   Tobacco comments:    Smoking 6 cigs/day  Substance and Sexual Activity   Alcohol use: No   Drug use: No   Sexual activity: Not Currently  Other Topics Concern   Not on file  Social History Narrative   Not on file   Social Determinants of Health   Financial Resource Strain: Not on file  Food Insecurity: Not on file  Transportation Needs: Not on file  Physical Activity: Not on file  Stress: Not on file  Social Connections: Not on file  Intimate Partner Violence: Not on file    Outpatient Medications Prior to  Visit  Medication Sig Dispense Refill   potassium chloride (KLOR-CON 10) 10 MEQ tablet Take 1 tablet (10 mEq total) by mouth daily. (Patient not taking: Reported on 10/24/2021) 30 tablet 6   cloNIDine (CATAPRES) 0.1 MG tablet TAKE 1 TABLET BY MOUTH THREE TIMES DAILY 90 tablet 0   famotidine (PEPCID) 20 MG tablet Take 1 tablet by mouth once daily 90 tablet 0   lisinopril-hydrochlorothiazide (ZESTORETIC) 20-25 MG tablet Take 1 tablet by mouth daily. 90 tablet 1   amLODipine (NORVASC) 5 MG tablet Take 1 tablet (5 mg total) by mouth daily. (Patient not taking: Reported on 10/24/2021) 90 tablet 0   traMADol (ULTRAM) 50 MG tablet Take 1 tablet (50 mg total) by mouth every 6 (six) hours as needed (pain not controlled with tylenol and ibuprofen). (Patient not taking: No sig reported) 30 tablet 0   hydrALAZINE (APRESOLINE) 50 MG tablet TAKE 1 TABLET BY MOUTH THREE TIMES DAILY 90 tablet 2   No facility-administered medications prior to visit.    No Known Allergies  ROS Review of Systems  Constitutional: Negative.   HENT: Negative.    Eyes: Negative.   Respiratory:  Negative for shortness of breath.   Cardiovascular:  Negative for chest pain.  Gastrointestinal: Negative.  Endocrine: Negative.   Genitourinary: Negative.   Musculoskeletal: Negative.   Skin: Negative.   Allergic/Immunologic: Negative.   Neurological: Negative.   Hematological: Negative.   Psychiatric/Behavioral: Negative.       Objective:    Physical Exam Vitals and nursing note reviewed.  Constitutional:      Appearance: Normal appearance.  HENT:     Head: Normocephalic and atraumatic.     Right Ear: External ear normal.     Left Ear: External ear normal.     Nose: Nose normal.     Mouth/Throat:     Mouth: Mucous membranes are moist.     Pharynx: Oropharynx is clear.  Eyes:     Extraocular Movements: Extraocular movements intact.     Conjunctiva/sclera: Conjunctivae normal.     Pupils: Pupils are equal, round,  and reactive to light.  Cardiovascular:     Rate and Rhythm: Normal rate and regular rhythm.     Pulses: Normal pulses.     Heart sounds: Normal heart sounds.  Pulmonary:     Effort: Pulmonary effort is normal.     Breath sounds: Normal breath sounds.  Musculoskeletal:        General: Normal range of motion.     Cervical back: Normal range of motion and neck supple.  Skin:    General: Skin is warm and dry.  Neurological:     General: No focal deficit present.     Mental Status: He is alert and oriented to person, place, and time.  Psychiatric:        Mood and Affect: Mood normal.        Behavior: Behavior normal.        Thought Content: Thought content normal.        Judgment: Judgment normal.    BP (!) 154/81 (BP Location: Left Arm, Patient Position: Sitting, Cuff Size: Normal)   Pulse 83   Temp 98.7 F (37.1 C) (Oral)   Resp 18   Ht 5' 9"  (1.753 m)   Wt 171 lb (77.6 kg)   SpO2 99%   BMI 25.25 kg/m  Wt Readings from Last 3 Encounters:  10/24/21 171 lb (77.6 kg)  03/23/21 165 lb 3.2 oz (74.9 kg)  02/02/20 162 lb (73.5 kg)     Health Maintenance Due  Topic Date Due   Pneumococcal Vaccine 84-55 Years old (1 - PCV) Never done   TETANUS/TDAP  Never done   COLONOSCOPY (Pts 45-40yr Insurance coverage will need to be confirmed)  Never done   Zoster Vaccines- Shingrix (1 of 2) Never done   COVID-19 Vaccine (3 - Booster for Pfizer series) 07/10/2020   INFLUENZA VACCINE  07/25/2021    There are no preventive care reminders to display for this patient.  Lab Results  Component Value Date   TSH 0.696 06/25/2013   Lab Results  Component Value Date   WBC 7.6 01/12/2020   HGB 14.3 01/12/2020   HCT 43.1 01/12/2020   MCV 84.0 01/12/2020   PLT 169 01/12/2020   Lab Results  Component Value Date   NA 141 03/23/2021   K 3.2 (L) 03/23/2021   CO2 24 03/23/2021   GLUCOSE 150 (H) 03/23/2021   BUN 13 03/23/2021   CREATININE 0.83 03/23/2021   BILITOT 0.2 05/04/2020    ALKPHOS 151 (H) 05/04/2020   AST 14 05/04/2020   ALT 13 05/04/2020   PROT 6.5 05/04/2020   ALBUMIN 4.0 05/04/2020   CALCIUM 9.5 03/23/2021   ANIONGAP 9  01/11/2020   EGFR 100 03/23/2021   Lab Results  Component Value Date   CHOL 176 05/04/2020   Lab Results  Component Value Date   HDL 41 05/04/2020   Lab Results  Component Value Date   LDLCALC 106 (H) 05/04/2020   Lab Results  Component Value Date   TRIG 168 (H) 05/04/2020   Lab Results  Component Value Date   CHOLHDL 4.3 05/04/2020   Lab Results  Component Value Date   HGBA1C 5.6 06/12/2013      Assessment & Plan:   Problem List Items Addressed This Visit       Cardiovascular and Mediastinum   Essential hypertension - Primary   Relevant Medications   cloNIDine (CATAPRES) 0.1 MG tablet   hydrALAZINE (APRESOLINE) 50 MG tablet   lisinopril-hydrochlorothiazide (ZESTORETIC) 20-25 MG tablet   Other Relevant Orders   CBC with Differential/Platelet   Comp. Metabolic Panel (12)   TSH     Digestive   Acid reflux disease   Relevant Medications   famotidine (PEPCID) 20 MG tablet     Other   Tobacco abuse   Prediabetes   Hypokalemia   BMI 25.0-25.9,adult   Other Visit Diagnoses     Screening, lipid       Relevant Orders   Lipid panel   Screening PSA (prostate specific antigen)       Relevant Orders   PSA       Meds ordered this encounter  Medications   cloNIDine (CATAPRES) 0.1 MG tablet    Sig: Take 1 tablet (0.1 mg total) by mouth 3 (three) times daily.    Dispense:  90 tablet    Refill:  1    Order Specific Question:   Supervising Provider    Answer:   Asencion Noble E [[id number]]   famotidine (PEPCID) 20 MG tablet    Sig: Take 1 tablet (20 mg total) by mouth daily.    Dispense:  90 tablet    Refill:  1    Order Specific Question:   Supervising Provider    Answer:   Asencion Noble E [[id number]]   hydrALAZINE (APRESOLINE) 50 MG tablet    Sig: Take 1 tablet (50 mg total) by mouth 3 (three) times  daily.    Dispense:  90 tablet    Refill:  1    Order Specific Question:   Supervising Provider    Answer:   Elsie Stain [[id number]]   lisinopril-hydrochlorothiazide (ZESTORETIC) 20-25 MG tablet    Sig: Take 1 tablet by mouth daily.    Dispense:  90 tablet    Refill:  1    Order Specific Question:   Supervising Provider    Answer:   WRIGHT, PATRICK E [[id number]]   1. Essential hypertension Continue current regimen.  Patient encouraged to check blood pressure at home, keep a written log and have available for all office visits.  Patient does not want to restart amlodipine at this time.  Red flags given for prompt reevaluation.  Patient to return for fasting labs. - CBC with Differential/Platelet; Future - Comp. Metabolic Panel (12); Future - TSH; Future - cloNIDine (CATAPRES) 0.1 MG tablet; Take 1 tablet (0.1 mg total) by mouth 3 (three) times daily.  Dispense: 90 tablet; Refill: 1 - hydrALAZINE (APRESOLINE) 50 MG tablet; Take 1 tablet (50 mg total) by mouth 3 (three) times daily.  Dispense: 90 tablet; Refill: 1 - lisinopril-hydrochlorothiazide (ZESTORETIC) 20-25 MG tablet; Take 1 tablet by mouth daily.  Dispense: 90  tablet; Refill: 1  2. Prediabetes A1C 6.1.  Patient encouraged to eliminate sugary drinks, patient education given on other lifestyle modifications.  3. Hypokalemia   4. Gastroesophageal reflux disease without esophagitis Continue current regimen - famotidine (PEPCID) 20 MG tablet; Take 1 tablet (20 mg total) by mouth daily.  Dispense: 90 tablet; Refill: 1  5. Tobacco abuse Patient education given on smoking cessation  6. Screening, lipid  - Lipid panel; Future  7. Screening PSA (prostate specific antigen)  - PSA; Future  8. BMI 25.0-25.9,adult   I have reviewed the patient's medical history (PMH, PSH, Social History, Family History, Medications, and allergies) , and have been updated if relevant. I spent 30 minutes reviewing chart and  face to face time with  patient.      Follow-up: Return in about 1 day (around 10/25/2021) for Fasting  labs, At [facility].    Loraine Grip Mayers, PA-C

## 2021-10-24 NOTE — Progress Notes (Signed)
Patient has eaten today and taken hydralazine and clonidine. Patient has been out of Lisinopril-HTCZ for 3 days and stopped amlodipine 1 month ago per his preference. Patient denies pain at this time.

## 2021-10-25 ENCOUNTER — Ambulatory Visit: Payer: BC Managed Care – PPO | Attending: Family Medicine

## 2021-10-25 DIAGNOSIS — Z13220 Encounter for screening for lipoid disorders: Secondary | ICD-10-CM | POA: Diagnosis not present

## 2021-10-25 DIAGNOSIS — E876 Hypokalemia: Secondary | ICD-10-CM | POA: Insufficient documentation

## 2021-10-25 DIAGNOSIS — Z125 Encounter for screening for malignant neoplasm of prostate: Secondary | ICD-10-CM | POA: Diagnosis not present

## 2021-10-25 DIAGNOSIS — I10 Essential (primary) hypertension: Secondary | ICD-10-CM

## 2021-10-25 DIAGNOSIS — Z6825 Body mass index (BMI) 25.0-25.9, adult: Secondary | ICD-10-CM | POA: Insufficient documentation

## 2021-10-25 DIAGNOSIS — R7303 Prediabetes: Secondary | ICD-10-CM | POA: Insufficient documentation

## 2021-10-26 ENCOUNTER — Other Ambulatory Visit: Payer: BC Managed Care – PPO

## 2021-10-26 LAB — CBC WITH DIFFERENTIAL/PLATELET
Basophils Absolute: 0 10*3/uL (ref 0.0–0.2)
Basos: 1 %
EOS (ABSOLUTE): 0.4 10*3/uL (ref 0.0–0.4)
Eos: 8 %
Hematocrit: 48.9 % (ref 37.5–51.0)
Hemoglobin: 16.6 g/dL (ref 13.0–17.7)
Immature Grans (Abs): 0 10*3/uL (ref 0.0–0.1)
Immature Granulocytes: 0 %
Lymphocytes Absolute: 1.8 10*3/uL (ref 0.7–3.1)
Lymphs: 34 %
MCH: 27.5 pg (ref 26.6–33.0)
MCHC: 33.9 g/dL (ref 31.5–35.7)
MCV: 81 fL (ref 79–97)
Monocytes Absolute: 0.5 10*3/uL (ref 0.1–0.9)
Monocytes: 10 %
Neutrophils Absolute: 2.4 10*3/uL (ref 1.4–7.0)
Neutrophils: 47 %
Platelets: 178 10*3/uL (ref 150–450)
RBC: 6.04 x10E6/uL — ABNORMAL HIGH (ref 4.14–5.80)
RDW: 13.4 % (ref 11.6–15.4)
WBC: 5.2 10*3/uL (ref 3.4–10.8)

## 2021-10-26 LAB — LIPID PANEL
Chol/HDL Ratio: 3.9 ratio (ref 0.0–5.0)
Cholesterol, Total: 174 mg/dL (ref 100–199)
HDL: 45 mg/dL (ref >39)
LDL Chol Calc (NIH): 111 mg/dL — ABNORMAL HIGH (ref 0–99)
Triglycerides: 99 mg/dL (ref 0–149)
VLDL Cholesterol Cal: 18 mg/dL (ref 5–40)

## 2021-10-26 LAB — TSH: TSH: 0.54 u[IU]/mL (ref 0.450–4.500)

## 2021-10-26 LAB — COMP. METABOLIC PANEL (12)
AST: 17 IU/L (ref 0–40)
Albumin/Globulin Ratio: 1.8 (ref 1.2–2.2)
Albumin: 4.5 g/dL (ref 3.8–4.9)
Alkaline Phosphatase: 144 IU/L — ABNORMAL HIGH (ref 44–121)
BUN/Creatinine Ratio: 9 — ABNORMAL LOW (ref 10–24)
BUN: 11 mg/dL (ref 8–27)
Bilirubin Total: 0.6 mg/dL (ref 0.0–1.2)
Calcium: 9.4 mg/dL (ref 8.6–10.2)
Chloride: 96 mmol/L (ref 96–106)
Creatinine, Ser: 1.24 mg/dL (ref 0.76–1.27)
Globulin, Total: 2.5 g/dL (ref 1.5–4.5)
Glucose: 109 mg/dL — ABNORMAL HIGH (ref 70–99)
Potassium: 3.4 mmol/L — ABNORMAL LOW (ref 3.5–5.2)
Sodium: 138 mmol/L (ref 134–144)
Total Protein: 7 g/dL (ref 6.0–8.5)
eGFR: 67 mL/min/{1.73_m2} (ref >59)

## 2021-10-26 LAB — PSA: Prostate Specific Ag, Serum: 0.5 ng/mL (ref 0.0–4.0)

## 2021-10-26 MED ORDER — ATORVASTATIN CALCIUM 10 MG PO TABS: 10.0000 mg | ORAL_TABLET | Freq: Every day | ORAL | 3 refills | Status: DC

## 2021-10-26 MED ORDER — POTASSIUM CHLORIDE ER 10 MEQ PO TBCR: 10.0000 meq | EXTENDED_RELEASE_TABLET | Freq: Every day | ORAL | 6 refills | Status: DC

## 2021-10-26 NOTE — Addendum Note (Signed)
Addended by: Roney Jaffe on: 10/26/2021 10:17 AM   Modules accepted: Orders

## 2021-10-27 ENCOUNTER — Telehealth: Payer: Self-pay | Admitting: *Deleted

## 2021-10-27 NOTE — Telephone Encounter (Signed)
MA attempted to reach patient x2 with no success Medical Assistant left message on patient's home and cell voicemail. Voicemail states to give a call back to [place] with [facility] at [phone].

## 2021-10-27 NOTE — Telephone Encounter (Signed)
-----   Message from Roney Jaffe, [place] sent at 10/26/2021 10:17 AM EDT ----- Please call patient and let him know that his thyroid function and kidney function are within normal limits.  He continues to have a liver enzyme that is slightly elevated, he should have follow-up labs completed.  He does not need to be fasting.  His potassium is slightly below normal limits, I do encourage him to resume taking the potassium and continue to have this level monitored.  His cholesterol overall is within normal limits, however his LDL continues to be elevated, his risk of a cardiovascular event in the next 10 years is 29.7%.  He should start a low-dose cholesterol medication at this time and follow a low-cholesterol diet.   The 10-year ASCVD risk score (Arnett DK, et al., 2019) is: 29.7%   Values used to calculate the score:     Age: 60 years     Sex: Male     Is Non-Hispanic African American: Yes     Diabetic: No     Tobacco smoker: Yes     Systolic Blood Pressure: 154 mmHg     Is BP treated: Yes     HDL Cholesterol: 45 mg/dL     Total Cholesterol: 174 mg/dL

## 2021-11-22 DIAGNOSIS — T1502XA Foreign body in cornea, left eye, initial encounter: Secondary | ICD-10-CM | POA: Diagnosis not present

## 2022-01-24 ENCOUNTER — Encounter: Payer: Self-pay | Admitting: Family Medicine

## 2022-01-24 ENCOUNTER — Ambulatory Visit: Payer: BC Managed Care – PPO | Attending: Family Medicine | Admitting: Family Medicine

## 2022-01-24 ENCOUNTER — Other Ambulatory Visit: Payer: Self-pay

## 2022-01-24 DIAGNOSIS — E876 Hypokalemia: Secondary | ICD-10-CM

## 2022-01-24 DIAGNOSIS — K219 Gastro-esophageal reflux disease without esophagitis: Secondary | ICD-10-CM

## 2022-01-24 DIAGNOSIS — Z9189 Other specified personal risk factors, not elsewhere classified: Secondary | ICD-10-CM | POA: Diagnosis not present

## 2022-01-24 DIAGNOSIS — I10 Essential (primary) hypertension: Secondary | ICD-10-CM | POA: Diagnosis not present

## 2022-01-24 MED ORDER — FAMOTIDINE 20 MG PO TABS: 20.0000 mg | ORAL_TABLET | Freq: Every day | ORAL | 1 refills | Status: DC

## 2022-01-24 MED ORDER — POTASSIUM CHLORIDE ER 10 MEQ PO TBCR: 10.0000 meq | EXTENDED_RELEASE_TABLET | Freq: Every day | ORAL | 1 refills | Status: DC

## 2022-01-24 MED ORDER — ATORVASTATIN CALCIUM 10 MG PO TABS: 10.0000 mg | ORAL_TABLET | Freq: Every day | ORAL | 1 refills | Status: DC

## 2022-01-24 MED ORDER — LISINOPRIL-HYDROCHLOROTHIAZIDE 20-25 MG PO TABS: 1.0000 | ORAL_TABLET | Freq: Every day | ORAL | 1 refills | Status: DC

## 2022-01-24 MED ORDER — CLONIDINE HCL 0.1 MG PO TABS: 0.1000 mg | ORAL_TABLET | Freq: Three times a day (TID) | ORAL | 1 refills | Status: DC

## 2022-01-24 MED ORDER — HYDRALAZINE HCL 100 MG PO TABS: 50.0000 mg | ORAL_TABLET | Freq: Three times a day (TID) | ORAL | 1 refills | Status: DC

## 2022-01-24 NOTE — Patient Instructions (Signed)
Steps to Quit Smoking °Smoking tobacco is the leading cause of preventable death. It can affect almost every organ in the body. Smoking puts you and those around you at risk for developing many serious chronic diseases. Quitting smoking can be difficult, but it is one of the best things that you can do for your health. It is never too late to quit. °How do I get ready to quit? °When you decide to quit smoking, create a plan to help you succeed. Before you quit: °Pick a date to quit. Set a date within the next 2 weeks to give you time to prepare. °Write down the reasons why you are quitting. Keep this list in places where you will see it often. °Tell your family, friends, and co-workers that you are quitting. Support from your loved ones can make quitting easier. °Talk with your health care provider about your options for quitting smoking. °Find out what treatment options are covered by your health insurance. °Identify people, places, things, and activities that make you want to smoke (triggers). Avoid them. °What first steps can I take to quit smoking? °Throw away all cigarettes at home, at work, and in your car. °Throw away smoking accessories, such as ashtrays and lighters. °Clean your car. Make sure to empty the ashtray. °Clean your home, including curtains and carpets. °What strategies can I use to quit smoking? °Talk with your health care provider about combining strategies, such as taking medicines while you are also receiving in-person counseling. Using these two strategies together makes you more likely to succeed in quitting than if you used either strategy on its own. °If you are pregnant or breastfeeding, talk with your health care provider about finding counseling or other support strategies to quit smoking. Do not take medicine to help you quit smoking unless your health care provider tells you to do so. °To quit smoking: °Quit right away °Quit smoking completely, instead of gradually reducing how much  you smoke over a period of time. Research shows that stopping smoking right away is more successful than gradually quitting. °Attend in-person counseling to help you build problem-solving skills. You are more likely to succeed in quitting if you attend counseling sessions regularly. Even short sessions of 10 minutes can be effective. °Take medicine °You may take medicines to help you quit smoking. Some medicines require a prescription and some you can purchase over-the-counter. Medicines may have nicotine in them to replace the nicotine in cigarettes. Medicines may: °Help to stop cravings. °Help to relieve withdrawal symptoms. °Your health care provider may recommend: °Nicotine patches, gum, or lozenges. °Nicotine inhalers or sprays. °Non-nicotine medicine that is taken by mouth. °Find resources °Find resources and support systems that can help you to quit smoking and remain smoke-free after you quit. These resources are most helpful when you use them often. They include: °Online chats with a counselor. °Telephone quitlines. °Printed self-help materials. °Support groups or group counseling. °Text messaging programs. °Mobile phone apps or applications. Use apps that can help you stick to your quit plan by providing reminders, tips, and encouragement. There are many free apps for mobile devices as well as websites. Examples include Quit Guide from the CDC and smokefree.gov °What things can I do to make it easier to quit? ° °Reach out to your family and friends for support and encouragement. Call telephone quitlines (1-800-QUIT-NOW), reach out to support groups, or work with a counselor for support. °Ask people who smoke to avoid smoking around you. °Avoid places that trigger you   to smoke, such as bars, parties, or smoke-break areas at work. °Spend time with people who do not smoke. °Lessen the stress in your life. Stress can be a smoking trigger for some people. To lessen stress, try: °Exercising regularly. °Doing  deep-breathing exercises. °Doing yoga. °Meditating. °Performing a body scan. This involves closing your eyes, scanning your body from head to toe, and noticing which parts of your body are particularly tense. Try to relax the muscles in those areas. °How will I feel when I quit smoking? °Day 1 to 3 weeks °Within the first 24 hours of quitting smoking, you may start to feel withdrawal symptoms. These symptoms are usually most noticeable 2-3 days after quitting, but they usually do not last for more than 2-3 weeks. You may experience these symptoms: °Mood swings. °Restlessness, anxiety, or irritability. °Trouble concentrating. °Dizziness. °Strong cravings for sugary foods and nicotine. °Mild weight gain. °Constipation. °Nausea. °Coughing or a sore throat. °Changes in how the medicines that you take for unrelated issues work in your body. °Depression. °Trouble sleeping (insomnia). °Week 3 and afterward °After the first 2-3 weeks of quitting, you may start to notice more positive results, such as: °Improved sense of smell and taste. °Decreased coughing and sore throat. °Slower heart rate. °Lower blood pressure. °Clearer skin. °The ability to breathe more easily. °Fewer sick days. °Quitting smoking can be very challenging. Do not get discouraged if you are not successful the first time. Some people need to make many attempts to quit before they achieve long-term success. Do your best to stick to your quit plan, and talk with your health care provider if you have any questions or concerns. °Summary °Smoking tobacco is the leading cause of preventable death. Quitting smoking is one of the best things that you can do for your health. °When you decide to quit smoking, create a plan to help you succeed. °Quit smoking right away, not slowly over a period of time. °When you start quitting, seek help from your health care provider, family, or friends. °This information is not intended to replace advice given to you by your  health care provider. Make sure you discuss any questions you have with your health care provider. °Document Revised: 08/19/2021 Document Reviewed: 03/01/2019 °Elsevier Patient Education © 2022 [employer] ° °

## 2022-01-24 NOTE — Progress Notes (Signed)
Subjective:  Patient ID: Frank Warren, male    DOB: Nov 28, 1961  Age: 61 y.o. MRN: [id number]  CC: Hypertension   HPI Frank Warren is a 61 y.o. year old male with a history of  hypertension, GERD who presents today for follow-up visit.    Interval History: He has not been taking Amlodipine even though it appears on his med list as he complains it causes ED.  Endorses compliance with his other antihypertensive but has not been checking his blood pressures at home.  He does not exercise regularly. Also on potassium due to hypokalemia. After his last visit he was started on a statin due to his increased cardiovascular risk but he states he was unaware of this. Denies acute concerns today. Continues to smoke cigarettes and is not ready to quit. Past Medical History:  Diagnosis Date   Acid reflux disease    Dyslipidemia    Hyperlipidemia    Hypertension    Tobacco abuse     No past surgical history on file.  No family history on file.  No Known Allergies  Outpatient Medications Prior to Visit  Medication Sig Dispense Refill   amLODipine (NORVASC) 5 MG tablet Take 1 tablet (5 mg total) by mouth daily. 90 tablet 0   atorvastatin (LIPITOR) 10 MG tablet Take 1 tablet (10 mg total) by mouth daily. 90 tablet 3   cloNIDine (CATAPRES) 0.1 MG tablet Take 1 tablet (0.1 mg total) by mouth 3 (three) times daily. 90 tablet 1   famotidine (PEPCID) 20 MG tablet Take 1 tablet (20 mg total) by mouth daily. 90 tablet 1   hydrALAZINE (APRESOLINE) 50 MG tablet Take 1 tablet (50 mg total) by mouth 3 (three) times daily. 90 tablet 1   lisinopril-hydrochlorothiazide (ZESTORETIC) 20-25 MG tablet Take 1 tablet by mouth daily. 90 tablet 1   potassium chloride (KLOR-CON 10) 10 MEQ tablet Take 1 tablet (10 mEq total) by mouth daily. 30 tablet 6   traMADol (ULTRAM) 50 MG tablet Take 1 tablet (50 mg total) by mouth every 6 (six) hours as needed (pain not controlled with tylenol and ibuprofen). (Patient not  taking: Reported on 03/23/2021) 30 tablet 0   No facility-administered medications prior to visit.     ROS Review of Systems  Constitutional:  Negative for activity change and appetite change.  HENT:  Negative for sinus pressure and sore throat.   Eyes:  Negative for visual disturbance.  Respiratory:  Negative for cough, chest tightness and shortness of breath.   Cardiovascular:  Negative for chest pain and leg swelling.  Gastrointestinal:  Negative for abdominal distention, abdominal pain, constipation and diarrhea.  Endocrine: Negative.   Genitourinary:  Negative for dysuria.  Musculoskeletal:  Negative for joint swelling and myalgias.  Skin:  Negative for rash.  Allergic/Immunologic: Negative.   Neurological:  Negative for weakness, light-headedness and numbness.  Psychiatric/Behavioral:  Negative for dysphoric mood and suicidal ideas.    Objective:  BP (!) 147/89 (BP Location: Right Arm)    Pulse 65    Ht 5\' 9"  (1.753 m)    Wt 165 lb 12.8 oz (75.2 kg)    SpO2 100%    BMI 24.48 kg/m   BP/Weight 01/24/2022 [id number]  Systolic BP [id number]  Diastolic BP 89 81 86  Wt. (Lbs) 165.8 171 -  BMI 24.48 25.25 -      Physical Exam Constitutional:      Appearance: He is well-developed.  Cardiovascular:  Rate and Rhythm: Normal rate.     Heart sounds: Normal heart sounds. No murmur heard. Pulmonary:     Effort: Pulmonary effort is normal.     Breath sounds: Normal breath sounds. No wheezing or rales.  Chest:     Chest wall: No tenderness.  Abdominal:     General: Bowel sounds are normal. There is no distension.     Palpations: Abdomen is soft. There is no mass.     Tenderness: There is no abdominal tenderness.  Musculoskeletal:        General: Normal range of motion.     Right lower leg: No edema.     Left lower leg: No edema.  Neurological:     Mental Status: He is alert and oriented to person, place, and time.  Psychiatric:        Mood and Affect: Mood  normal.    CMP Latest Ref Rng & Units 10/25/2021 03/23/2021 05/04/2020  Glucose 70 - 99 mg/dL 109(H) 150(H) 117(H)  BUN 8 - 27 mg/dL 11 13 14   Creatinine 0.76 - 1.27 mg/dL 1.24 0.83 0.97  Sodium 134 - 144 mmol/L 138 141 142  Potassium 3.5 - 5.2 mmol/L 3.4(L) 3.2(L) 3.5  Chloride 96 - 106 mmol/L 96 98 102  CO2 20 - 29 mmol/L - 24 27  Calcium 8.6 - 10.2 mg/dL 9.4 9.5 9.6  Total Protein 6.0 - 8.5 g/dL 7.0 - 6.5  Total Bilirubin 0.0 - 1.2 mg/dL 0.6 - 0.2  Alkaline Phos 44 - 121 IU/L 144(H) - 151(H)  AST 0 - 40 IU/L 17 - 14  ALT 0 - 44 IU/L - - 13    Lipid Panel     Component Value Date/Time   CHOL 174 10/25/2021 0837   TRIG 99 10/25/2021 0837   HDL 45 10/25/2021 0837   CHOLHDL 3.9 10/25/2021 0837   CHOLHDL 3.7 09/22/2016 1356   VLDL 16 09/22/2016 1356   LDLCALC 111 (H) 10/25/2021 0837    CBC    Component Value Date/Time   WBC 5.2 10/25/2021 0837   WBC 7.6 01/12/2020 0453   RBC 6.04 (H) 10/25/2021 0837   RBC 5.13 01/12/2020 0453   HGB 16.6 10/25/2021 0837   HCT 48.9 10/25/2021 0837   PLT 178 10/25/2021 0837   MCV 81 10/25/2021 0837   MCH 27.5 10/25/2021 0837   MCH 27.9 01/12/2020 0453   MCHC 33.9 10/25/2021 0837   MCHC 33.2 01/12/2020 0453   RDW 13.4 10/25/2021 0837   LYMPHSABS 1.8 10/25/2021 0837   MONOABS 0.4 01/11/2020 2046   EOSABS 0.4 10/25/2021 0837   BASOSABS 0.0 10/25/2021 0837    Lab Results  Component Value Date   HGBA1C 5.6 06/12/2013    The 10-year ASCVD risk score (Arnett DK, et al., 2019) is: 28.5%   Values used to calculate the score:     Age: 61 years     Sex: Male     Is Non-Hispanic African American: Yes     Diabetic: No     Tobacco smoker: Yes     Systolic Blood Pressure: Q000111Q mmHg     Is BP treated: Yes     HDL Cholesterol: 45 mg/dL     Total Cholesterol: 174 mg/dL  Assessment & Plan:  1. Candidate for statin therapy due to risk of future cardiovascular event Counseled patient of his increased 10-year cardiovascular risk of  28.5% Encouraged to continue with Lipitor Advised of smoking cessation will also reduce his risk -  atorvastatin (LIPITOR) 10 MG tablet; Take 1 tablet (10 mg total) by mouth daily.  Dispense: 90 tablet; Refill: 1  2. Essential hypertension Slightly above goal Increased dose of hydralazine Have discontinued amlodipine due to complaint of ED Counseled on blood pressure goal of less than 130/80, low-sodium, DASH diet, medication compliance, 150 minutes of moderate intensity exercise per week. Discussed medication compliance, adverse effects. - cloNIDine (CATAPRES) 0.1 MG tablet; Take 1 tablet (0.1 mg total) by mouth 3 (three) times daily.  Dispense: 270 tablet; Refill: 1 - hydrALAZINE (APRESOLINE) 100 MG tablet; Take 0.5 tablets (50 mg total) by mouth 3 (three) times daily.  Dispense: 270 tablet; Refill: 1 - lisinopril-hydrochlorothiazide (ZESTORETIC) 20-25 MG tablet; Take 1 tablet by mouth daily.  Dispense: 90 tablet; Refill: 1  3. Gastroesophageal reflux disease without esophagitis Stable - famotidine (PEPCID) 20 MG tablet; Take 1 tablet (20 mg total) by mouth daily.  Dispense: 90 tablet; Refill: 1  4. Hypokalemia He was hypokalemic at last office visit potassium was 3.4 This is likely diuretic induced Continue potassium - potassium chloride (KLOR-CON 10) 10 MEQ tablet; Take 1 tablet (10 mEq total) by mouth daily.  Dispense: 90 tablet; Refill: 1   Health Care Maintenance: Declines referral for colonoscopy even though we have discussed the risks of his decision. Meds ordered this encounter  Medications   atorvastatin (LIPITOR) 10 MG tablet    Sig: Take 1 tablet (10 mg total) by mouth daily.    Dispense:  90 tablet    Refill:  1   cloNIDine (CATAPRES) 0.1 MG tablet    Sig: Take 1 tablet (0.1 mg total) by mouth 3 (three) times daily.    Dispense:  270 tablet    Refill:  1   hydrALAZINE (APRESOLINE) 100 MG tablet    Sig: Take 0.5 tablets (50 mg total) by mouth 3 (three) times daily.     Dispense:  270 tablet    Refill:  1    Dose increase   lisinopril-hydrochlorothiazide (ZESTORETIC) 20-25 MG tablet    Sig: Take 1 tablet by mouth daily.    Dispense:  90 tablet    Refill:  1   famotidine (PEPCID) 20 MG tablet    Sig: Take 1 tablet (20 mg total) by mouth daily.    Dispense:  90 tablet    Refill:  1   potassium chloride (KLOR-CON 10) 10 MEQ tablet    Sig: Take 1 tablet (10 mEq total) by mouth daily.    Dispense:  90 tablet    Refill:  1    Follow-up: Return in about 3 months (around 04/23/2022) for Chronic medical conditions.       Charlott Rakes, MD, FAAFP. [facility] [place], [place]   01/24/2022, 2:24 PM

## 2022-04-20 ENCOUNTER — Encounter: Payer: Self-pay | Admitting: Family Medicine

## 2022-04-20 ENCOUNTER — Ambulatory Visit: Payer: BC Managed Care – PPO | Attending: Family Medicine | Admitting: Family Medicine

## 2022-04-20 VITALS — BP 149/81 | HR 79 | Ht 69.0 in | Wt 166.0 lb

## 2022-04-20 DIAGNOSIS — I10 Essential (primary) hypertension: Secondary | ICD-10-CM | POA: Diagnosis not present

## 2022-04-20 DIAGNOSIS — R7303 Prediabetes: Secondary | ICD-10-CM | POA: Diagnosis not present

## 2022-04-20 DIAGNOSIS — F17200 Nicotine dependence, unspecified, uncomplicated: Secondary | ICD-10-CM | POA: Diagnosis not present

## 2022-04-20 DIAGNOSIS — Z9189 Other specified personal risk factors, not elsewhere classified: Secondary | ICD-10-CM

## 2022-04-20 DIAGNOSIS — G44209 Tension-type headache, unspecified, not intractable: Secondary | ICD-10-CM

## 2022-04-20 LAB — POCT GLYCOSYLATED HEMOGLOBIN (HGB A1C): HbA1c, POC (prediabetic range): 6 % (ref 5.7–6.4)

## 2022-04-20 NOTE — Progress Notes (Signed)
Medication refill

## 2022-04-20 NOTE — Progress Notes (Signed)
? ?Subjective:  ?Patient ID: Frank Warren, male    DOB: 1961/06/25  Age: 61 y.o. MRN: [id number] ? ?CC: Hypertension ? ? ?HPI ?Frank Warren is a 61 y.o. year old male with a history of hypertension, GERD who presents today for follow-up visit.   ?  ? ?Interval History: ?He smokes 8 Cigarettes/ day and has smoked for about 30 years.  Not ready to quit. ?Does not exercise at all. ?He has a headache which he thinks might be tension headache. OTC analgesics have been beneficial.  He has non/ vomiting/ blurry vision. ? ?Doing well on his antihypertensive and his reflux symptoms are controlled on his PPI. ?Denies additional concerns today. ?Past Medical History:  ?Diagnosis Date  ? Acid reflux disease   ? Dyslipidemia   ? Hyperlipidemia   ? Hypertension   ? Tobacco abuse   ? ? ?History reviewed. No pertinent surgical history. ? ?History reviewed. No pertinent family history. ? ?Social History  ? ?Socioeconomic History  ? Marital status: Married  ?  Spouse name: Not on file  ? Number of children: Not on file  ? Years of education: Not on file  ? Highest education level: Not on file  ?Occupational History  ? Not on file  ?Tobacco Use  ? Smoking status: Every Day  ?  Packs/day: 0.50  ?  Types: Cigarettes  ? Smokeless tobacco: Never  ? Tobacco comments:  ?  Smoking 6 cigs/day  ?Substance and Sexual Activity  ? Alcohol use: No  ? Drug use: No  ? Sexual activity: Not Currently  ?Other Topics Concern  ? Not on file  ?Social History Narrative  ? Not on file  ? ?Social Determinants of Health  ? ?Financial Resource Strain: Not on file  ?Food Insecurity: Not on file  ?Transportation Needs: Not on file  ?Physical Activity: Not on file  ?Stress: Not on file  ?Social Connections: Not on file  ? ? ?No Known Allergies ? ?Outpatient Medications Prior to Visit  ?Medication Sig Dispense Refill  ? atorvastatin (LIPITOR) 10 MG tablet Take 1 tablet (10 mg total) by mouth daily. 90 tablet 1  ? cloNIDine (CATAPRES) 0.1 MG tablet Take 1 tablet  (0.1 mg total) by mouth 3 (three) times daily. 270 tablet 1  ? famotidine (PEPCID) 20 MG tablet Take 1 tablet (20 mg total) by mouth daily. 90 tablet 1  ? hydrALAZINE (APRESOLINE) 100 MG tablet Take 0.5 tablets (50 mg total) by mouth 3 (three) times daily. 270 tablet 1  ? lisinopril-hydrochlorothiazide (ZESTORETIC) 20-25 MG tablet Take 1 tablet by mouth daily. 90 tablet 1  ? potassium chloride (KLOR-CON 10) 10 MEQ tablet Take 1 tablet (10 mEq total) by mouth daily. 90 tablet 1  ? traMADol (ULTRAM) 50 MG tablet Take 1 tablet (50 mg total) by mouth every 6 (six) hours as needed (pain not controlled with tylenol and ibuprofen). 30 tablet 0  ? ?No facility-administered medications prior to visit.  ? ? ? ?ROS ?Review of Systems  ?Constitutional:  Negative for activity change and appetite change.  ?HENT:  Negative for sinus pressure and sore throat.   ?Eyes:  Negative for visual disturbance.  ?Respiratory:  Negative for cough, chest tightness and shortness of breath.   ?Cardiovascular:  Negative for chest pain and leg swelling.  ?Gastrointestinal:  Negative for abdominal distention, abdominal pain, constipation and diarrhea.  ?Endocrine: Negative.   ?Genitourinary:  Negative for dysuria.  ?Musculoskeletal:  Negative for joint swelling and myalgias.  ?Skin:  Negative for rash.  ?Allergic/Immunologic: Negative.   ?Neurological:  Positive for headaches. Negative for weakness, light-headedness and numbness.  ?Psychiatric/Behavioral:  Negative for dysphoric mood and suicidal ideas.   ? ?Objective:  ?BP (!) 149/81   Pulse 79   Ht 5\' 9"  (1.753 m)   Wt 166 lb (75.3 kg)   SpO2 99%   BMI 24.51 kg/m?  ? ? ?  04/20/2022  ?  9:08 AM 01/24/2022  ?  1:53 PM 01/24/2022  ?  1:46 PM  ?BP/Weight  ?Systolic BP 123456 Q000111Q [id number]  ?Diastolic BP 81 89 90  ?Wt. (Lbs) 166  165.8  ?BMI 24.51 kg/m2  24.48 kg/m2  ? ? ? ? ?Physical Exam ?Constitutional:   ?   Appearance: He is well-developed.  ?Cardiovascular:  ?   Rate and Rhythm: Normal rate.  ?    Heart sounds: Normal heart sounds. No murmur heard. ?Pulmonary:  ?   Effort: Pulmonary effort is normal.  ?   Breath sounds: Normal breath sounds. No wheezing or rales.  ?Chest:  ?   Chest wall: No tenderness.  ?Abdominal:  ?   General: Bowel sounds are normal. There is no distension.  ?   Palpations: Abdomen is soft. There is no mass.  ?   Tenderness: There is no abdominal tenderness.  ?Musculoskeletal:     ?   General: Normal range of motion.  ?   Cervical back: Normal range of motion.  ?   Right lower leg: No edema.  ?   Left lower leg: No edema.  ?Neurological:  ?   Mental Status: He is alert and oriented to person, place, and time.  ?Psychiatric:     ?   Mood and Affect: Mood normal.  ? ? ? ?  Latest Ref Rng & Units 10/25/2021  ?  8:37 AM 03/23/2021  ? 12:13 PM 05/04/2020  ? 10:00 AM  ?CMP  ?Glucose 70 - 99 mg/dL 109   150   117    ?BUN 8 - 27 mg/dL 11   13   14     ?Creatinine 0.76 - 1.27 mg/dL 1.24   0.83   0.97    ?Sodium 134 - 144 mmol/L 138   141   142    ?Potassium 3.5 - 5.2 mmol/L 3.4   3.2   3.5    ?Chloride 96 - 106 mmol/L 96   98   102    ?CO2 20 - 29 mmol/L  24   27    ?Calcium 8.6 - 10.2 mg/dL 9.4   9.5   9.6    ?Total Protein 6.0 - 8.5 g/dL 7.0    6.5    ?Total Bilirubin 0.0 - 1.2 mg/dL 0.6    0.2    ?Alkaline Phos 44 - 121 IU/L 144    151    ?AST 0 - 40 IU/L 17    14    ?ALT 0 - 44 IU/L   13    ? ? ?Lipid Panel  ?   ?Component Value Date/Time  ? CHOL 174 10/25/2021 0837  ? TRIG 99 10/25/2021 0837  ? HDL 45 10/25/2021 0837  ? CHOLHDL 3.9 10/25/2021 0837  ? CHOLHDL 3.7 09/22/2016 1356  ? VLDL 16 09/22/2016 1356  ? LDLCALC 111 (H) 10/25/2021 [id number]  ? ? ?CBC ?   ?Component Value Date/Time  ? WBC 5.2 10/25/2021 0837  ? WBC 7.6 01/12/2020 0453  ? RBC 6.04 (H) 10/25/2021 [id number]  ? RBC 5.13  01/12/2020 0453  ? HGB 16.6 10/25/2021 0837  ? HCT 48.9 10/25/2021 0837  ? PLT 178 10/25/2021 0837  ? MCV 81 10/25/2021 0837  ? MCH 27.5 10/25/2021 0837  ? MCH 27.9 01/12/2020 0453  ? MCHC 33.9 10/25/2021 0837  ? MCHC 33.2  01/12/2020 0453  ? RDW 13.4 10/25/2021 0837  ? LYMPHSABS 1.8 10/25/2021 0837  ? MONOABS 0.4 01/11/2020 2046  ? EOSABS 0.4 10/25/2021 0837  ? BASOSABS 0.0 10/25/2021 0837  ? ? ?Lab Results  ?Component Value Date  ? HGBA1C 6.0 04/20/2022  ? ? ?Assessment & Plan:  ?1. Prediabetes ?Labs reveal prediabetes with an A1c of 6.0.  Working on a low carbohydrate diet, exercise, weight loss is recommended in order to prevent progression to type 2 diabetes mellitus. ? ?- POCT glycosylated hemoglobin (Hb A1C) ? ?2. Essential hypertension ?Slightly above goal ?No regimen change today but he has been advised to worked on stricter lifestyle modifications and if blood pressure remains above goal at next visit I will adjust his regimen ?Counseled on blood pressure goal of less than 130/80, low-sodium, DASH diet, medication compliance, 150 minutes of moderate intensity exercise per week. ?Discussed medication compliance, adverse effects. ?- Basic Metabolic Panel ? ?3. Candidate for statin therapy due to risk of future cardiovascular event ?Currently on a statin ?Risk factor modification including smoking cessation ? ?4. Tobacco dependence ?Smoking cessation support: smoking cessation hotline: 1-800-QUIT-NOW.  Smoking cessation classes are available through [facility]. Call [phone] or visit our website at [email or web address] ? ?Spent 3 minutes counseling on dangers of tobacco use and benefits of quitting, offered pharmacological intervention to aid quitting and patient is not ready to quit. ?He does not meet criteria for lung cancer screening ? ?5. Tension headache ?No red flags ?Symptoms are controlled on OTC analgesics ? ? ?No orders of the defined types were placed in this encounter. ? ? ?Follow-up: Return in about 3 months (around 07/20/2022) for Chronic medical conditions.  ? ? ? ? ? ?Charlott Rakes, MD, FAAFP. ?[facility] ?[place], [place] ?[phone]    ?04/20/2022, 10:30 AM ?

## 2022-04-20 NOTE — Patient Instructions (Signed)

## 2022-04-21 LAB — BASIC METABOLIC PANEL
BUN/Creatinine Ratio: 10 (ref 10–24)
BUN: 11 mg/dL (ref 8–27)
CO2: 26 mmol/L (ref 20–29)
Calcium: 9.7 mg/dL (ref 8.6–10.2)
Chloride: 97 mmol/L (ref 96–106)
Creatinine, Ser: 1.05 mg/dL (ref 0.76–1.27)
Glucose: 180 mg/dL — ABNORMAL HIGH (ref 70–99)
Potassium: 3.6 mmol/L (ref 3.5–5.2)
Sodium: 145 mmol/L — ABNORMAL HIGH (ref 134–144)
eGFR: 81 mL/min/{1.73_m2} (ref >59)

## 2022-08-10 ENCOUNTER — Encounter: Payer: Self-pay | Admitting: Family Medicine

## 2022-08-10 ENCOUNTER — Ambulatory Visit: Payer: BC Managed Care – PPO | Attending: Family Medicine | Admitting: Family Medicine

## 2022-08-10 VITALS — BP 126/74 | HR 65 | Temp 98.2°F | Ht 69.0 in | Wt 161.8 lb

## 2022-08-10 DIAGNOSIS — E876 Hypokalemia: Secondary | ICD-10-CM

## 2022-08-10 DIAGNOSIS — K219 Gastro-esophageal reflux disease without esophagitis: Secondary | ICD-10-CM | POA: Diagnosis not present

## 2022-08-10 DIAGNOSIS — F17200 Nicotine dependence, unspecified, uncomplicated: Secondary | ICD-10-CM | POA: Diagnosis not present

## 2022-08-10 DIAGNOSIS — Z9189 Other specified personal risk factors, not elsewhere classified: Secondary | ICD-10-CM

## 2022-08-10 DIAGNOSIS — I10 Essential (primary) hypertension: Secondary | ICD-10-CM | POA: Diagnosis not present

## 2022-08-10 DIAGNOSIS — L84 Corns and callosities: Secondary | ICD-10-CM

## 2022-08-10 MED ORDER — CLONIDINE HCL 0.1 MG PO TABS: 0.1000 mg | ORAL_TABLET | Freq: Three times a day (TID) | ORAL | 1 refills | Status: DC

## 2022-08-10 MED ORDER — FAMOTIDINE 20 MG PO TABS: 20.0000 mg | ORAL_TABLET | Freq: Every day | ORAL | 1 refills | Status: DC

## 2022-08-10 MED ORDER — ATORVASTATIN CALCIUM 10 MG PO TABS: 10.0000 mg | ORAL_TABLET | Freq: Every day | ORAL | 1 refills | Status: DC

## 2022-08-10 MED ORDER — LISINOPRIL-HYDROCHLOROTHIAZIDE 20-25 MG PO TABS: 1.0000 | ORAL_TABLET | Freq: Every day | ORAL | 1 refills | Status: DC

## 2022-08-10 MED ORDER — POTASSIUM CHLORIDE ER 10 MEQ PO TBCR: 10.0000 meq | EXTENDED_RELEASE_TABLET | Freq: Every day | ORAL | 1 refills | Status: DC

## 2022-08-10 MED ORDER — HYDRALAZINE HCL 100 MG PO TABS: 50.0000 mg | ORAL_TABLET | Freq: Three times a day (TID) | ORAL | 1 refills | Status: DC

## 2022-08-10 NOTE — Progress Notes (Signed)
Subjective:  Patient ID: Frank Warren, male    DOB: [birth year]  Age: 61 y.o. MRN: [id number]  CC: Hypertension   HPI Frank Warren is a 61 y.o. year old male with a history of hypertension, GERD, nicotine dependence who presents today for follow-up visit.    Interval History: At his last visit his blood pressure was slightly elevated and he endorses adherence with his antihypertensive.  His blood pressure is normal today. Reflux symptoms are controlled on his PPI. He continues to smoke 6 to 8 cigarettes/day and is not interested in quitting at this time. He does have a corn on the sole of his left foot which is only painful when he walks on hard surfaces.  He is not interested in having this removed. Denies additional concerns.  Past Medical History:  Diagnosis Date   Acid reflux disease    Dyslipidemia    Hyperlipidemia    Hypertension    Tobacco abuse     No past surgical history on file.  No family history on file.  Social History   Socioeconomic History   Marital status: Married    Spouse name: Not on file   Number of children: Not on file   Years of education: Not on file   Highest education level: Not on file  Occupational History   Not on file  Tobacco Use   Smoking status: Every Day    Packs/day: 0.50    Types: Cigarettes   Smokeless tobacco: Never   Tobacco comments:    Smoking 6 cigs/day  Substance and Sexual Activity   Alcohol use: No   Drug use: No   Sexual activity: Not Currently  Other Topics Concern   Not on file  Social History Narrative   Not on file   Social Determinants of Health   Financial Resource Strain: Not on file  Food Insecurity: Not on file  Transportation Needs: Not on file  Physical Activity: Not on file  Stress: Not on file  Social Connections: Not on file    No Known Allergies  Outpatient Medications Prior to Visit  Medication Sig Dispense Refill   atorvastatin (LIPITOR) 10 MG tablet Take 1 tablet (10 mg total) by  mouth daily. 90 tablet 1   cloNIDine (CATAPRES) 0.1 MG tablet Take 1 tablet (0.1 mg total) by mouth 3 (three) times daily. 270 tablet 1   famotidine (PEPCID) 20 MG tablet Take 1 tablet (20 mg total) by mouth daily. 90 tablet 1   hydrALAZINE (APRESOLINE) 100 MG tablet Take 0.5 tablets (50 mg total) by mouth 3 (three) times daily. 270 tablet 1   lisinopril-hydrochlorothiazide (ZESTORETIC) 20-25 MG tablet Take 1 tablet by mouth daily. 90 tablet 1   potassium chloride (KLOR-CON 10) 10 MEQ tablet Take 1 tablet (10 mEq total) by mouth daily. 90 tablet 1   traMADol (ULTRAM) 50 MG tablet Take 1 tablet (50 mg total) by mouth every 6 (six) hours as needed (pain not controlled with tylenol and ibuprofen). (Patient not taking: Reported on 08/10/2022) 30 tablet 0   No facility-administered medications prior to visit.     ROS Review of Systems  Constitutional:  Negative for activity change and appetite change.  HENT:  Negative for sinus pressure and sore throat.   Respiratory:  Negative for chest tightness, shortness of breath and wheezing.   Cardiovascular:  Negative for chest pain and palpitations.  Gastrointestinal:  Negative for abdominal distention, abdominal pain and constipation.  Genitourinary: Negative.   Musculoskeletal:  Negative.   Psychiatric/Behavioral:  Negative for behavioral problems and dysphoric mood.     Objective:  BP 126/74   Pulse 65   Temp 98.2 F (36.8 C) (Oral)   Ht 5\' 9"  (1.753 m)   Wt 161 lb 12.8 oz (73.4 kg)   SpO2 99%   BMI 23.89 kg/m      08/10/2022   10:49 AM 04/20/2022    9:08 AM 01/24/2022    1:53 PM  BP/Weight  Systolic BP 126 149 147  Diastolic BP 74 81 89  Wt. (Lbs) 161.8 166   BMI 23.89 kg/m2 24.51 kg/m2       Physical Exam Constitutional:      Appearance: He is well-developed.  Cardiovascular:     Rate and Rhythm: Normal rate.     Heart sounds: Normal heart sounds. No murmur heard. Pulmonary:     Effort: Pulmonary effort is normal.      Breath sounds: Normal breath sounds. No wheezing or rales.  Chest:     Chest wall: No tenderness.  Abdominal:     General: Bowel sounds are normal. There is no distension.     Palpations: Abdomen is soft. There is no mass.     Tenderness: There is no abdominal tenderness.  Musculoskeletal:        General: Normal range of motion.     Right lower leg: No edema.     Left lower leg: No edema.  Skin:    Comments: Corn in upper half of sole of left foot  Neurological:     Mental Status: He is alert and oriented to person, place, and time.  Psychiatric:        Mood and Affect: Mood normal.        Latest Ref Rng & Units 04/20/2022    9:43 AM 10/25/2021    8:37 AM 03/23/2021   12:13 PM  CMP  Glucose 70 - 99 mg/dL 03/25/2021  540  981   BUN 8 - 27 mg/dL 11  11  13    Creatinine 0.76 - 1.27 mg/dL 191   4.78   Sodium 134 - 144 mmol/L 145  138  141   Potassium 3.5 - 5.2 mmol/L 3.6  3.4  3.2   Chloride 96 - 106 mmol/L 97  96  98   CO2 20 - 29 mmol/L 26   24   Calcium 8.6 - 10.2 mg/dL 9.7  9.4  9.5   Total Protein 6.0 - 8.5 g/dL  7.0    Total Bilirubin 0.0 - 1.2 mg/dL  0.6    Alkaline Phos 44 - 121 IU/L  144    AST 0 - 40 IU/L  17      Lipid Panel     Component Value Date/Time   CHOL 174 10/25/2021 0837   TRIG 99 10/25/2021 0837   HDL 45 10/25/2021 0837   CHOLHDL 3.9 10/25/2021 0837   CHOLHDL 3.7 09/22/2016 1356   VLDL 16 09/22/2016 1356   LDLCALC 111 (H) 10/25/2021 0837    CBC    Component Value Date/Time   WBC 5.2 10/25/2021 0837   WBC 7.6 01/12/2020 0453   RBC 6.04 (H) 10/25/2021 0837   RBC 5.13 01/12/2020 0453   HGB 16.6 10/25/2021 0837   HCT 48.9 10/25/2021 0837   PLT 178 10/25/2021 0837   MCV 81 10/25/2021 0837   MCH 27.5 10/25/2021 0837   MCH 27.9 01/12/2020 0453   MCHC 33.9 10/25/2021 0837   MCHC  33.2 01/12/2020 0453   RDW 13.4 10/25/2021 0837   LYMPHSABS 1.8 10/25/2021 0837   MONOABS 0.4 01/11/2020 2046   EOSABS 0.4 10/25/2021 0837   BASOSABS 0.0 10/25/2021  0837    Lab Results  Component Value Date   HGBA1C 6.0 04/20/2022    Assessment & Plan:  1. Essential hypertension Controlled Counseled on blood pressure goal of less than 130/80, low-sodium, DASH diet, medication compliance, 150 minutes of moderate intensity exercise per week. Discussed medication compliance, adverse effects. - cloNIDine (CATAPRES) 0.1 MG tablet; Take 1 tablet (0.1 mg total) by mouth 3 (three) times daily.  Dispense: 270 tablet; Refill: 1 - hydrALAZINE (APRESOLINE) 100 MG tablet; Take 0.5 tablets (50 mg total) by mouth 3 (three) times daily.  Dispense: 270 tablet; Refill: 1 - lisinopril-hydrochlorothiazide (ZESTORETIC) 20-25 MG tablet; Take 1 tablet by mouth daily.  Dispense: 90 tablet; Refill: 1  2. Candidate for statin therapy due to risk of future cardiovascular event Due for lipid panel We will check at next visit when he is fasting - atorvastatin (LIPITOR) 10 MG tablet; Take 1 tablet (10 mg total) by mouth daily.  Dispense: 90 tablet; Refill: 1  3. Gastroesophageal reflux disease without esophagitis Controlled - famotidine (PEPCID) 20 MG tablet; Take 1 tablet (20 mg total) by mouth daily.  Dispense: 90 tablet; Refill: 1  4. Hypokalemia Controlled - potassium chloride (KLOR-CON 10) 10 MEQ tablet; Take 1 tablet (10 mEq total) by mouth daily.  Dispense: 90 tablet; Refill: 1  5. Tobacco dependence Spent 3 minutes counseling on cessation but he is not ready to quit  6. Corn Offered to refer to podiatry but he is not interested at this time.    Meds ordered this encounter  Medications   atorvastatin (LIPITOR) 10 MG tablet    Sig: Take 1 tablet (10 mg total) by mouth daily.    Dispense:  90 tablet    Refill:  1   cloNIDine (CATAPRES) 0.1 MG tablet    Sig: Take 1 tablet (0.1 mg total) by mouth 3 (three) times daily.    Dispense:  270 tablet    Refill:  1   famotidine (PEPCID) 20 MG tablet    Sig: Take 1 tablet (20 mg total) by mouth daily.    Dispense:   90 tablet    Refill:  1   hydrALAZINE (APRESOLINE) 100 MG tablet    Sig: Take 0.5 tablets (50 mg total) by mouth 3 (three) times daily.    Dispense:  270 tablet    Refill:  1    Dose increase   lisinopril-hydrochlorothiazide (ZESTORETIC) 20-25 MG tablet    Sig: Take 1 tablet by mouth daily.    Dispense:  90 tablet    Refill:  1   potassium chloride (KLOR-CON 10) 10 MEQ tablet    Sig: Take 1 tablet (10 mEq total) by mouth daily.    Dispense:  90 tablet    Refill:  1    Follow-up: Return in about 3 months (around 11/10/2022) for Chronic Medical Conditions.       Hoy Register, MD, FAAFP. [facility] [place], [place] [phone]   08/10/2022, 11:13 AM

## 2022-08-10 NOTE — Patient Instructions (Signed)

## 2022-11-01 ENCOUNTER — Other Ambulatory Visit: Payer: Self-pay | Admitting: Family Medicine

## 2022-11-01 DIAGNOSIS — I10 Essential (primary) hypertension: Secondary | ICD-10-CM

## 2022-11-01 NOTE — Telephone Encounter (Signed)
Rx request is too soon, last refill 08/18/22 for 90 and 1 rf. Will refuse.  Requested Prescriptions  Pending Prescriptions Disp Refills   lisinopril-hydrochlorothiazide (ZESTORETIC) 20-25 MG tablet [Pharmacy Med Name: Lisinopril-hydroCHLOROthiazide 20-25 MG Oral Tablet] 30 tablet 0    Sig: Take 1 tablet by mouth once daily     Cardiovascular:  ACEI + Diuretic Combos Failed - 11/01/2022  9:32 AM      Failed - Na in normal range and within 180 days    Sodium  Date Value Ref Range Status  04/20/2022 145 (H) 134 - 144 mmol/L Final         Failed - K in normal range and within 180 days    Potassium  Date Value Ref Range Status  04/20/2022 3.6 3.5 - 5.2 mmol/L Final         Failed - Cr in normal range and within 180 days    Creat  Date Value Ref Range Status  09/22/2016 0.83 0.70 - 1.33 mg/dL Final    Comment:      For patients > or = 61 years of age: The upper reference limit for Creatinine is approximately 13% higher for people identified as African-American.      Creatinine, Ser  Date Value Ref Range Status  04/20/2022 1.05 0.76 - 1.27 mg/dL Final         Failed - eGFR is 30 or above and within 180 days    GFR, Est African American  Date Value Ref Range Status  09/22/2016 >89 >=60 mL/min Final   GFR calc Af Amer  Date Value Ref Range Status  05/04/2020 98 >59 mL/min/1.73 Final    Comment:    **[facility] currently reports eGFR in compliance with the current**   recommendations of the [employer]. [facility] will   update reporting as new guidelines are published from the NKF-ASN   Task force.    GFR, Est Non African American  Date Value Ref Range Status  09/22/2016 >89 >=60 mL/min Final   GFR calc non Af Amer  Date Value Ref Range Status  05/04/2020 85 >59 mL/min/1.73 Final   eGFR  Date Value Ref Range Status  04/20/2022 81 >59 mL/min/1.73 Final         Passed - Patient is not pregnant      Passed - Last BP in normal range    BP Readings from  Last 1 Encounters:  08/10/22 126/74         Passed - Valid encounter within last 6 months    Recent Outpatient Visits           2 months ago Tobacco dependence   [place], Charlane Ferretti, MD   6 months ago Prediabetes   [place], [place], MD   9 months ago Candidate for statin therapy due to risk of future cardiovascular event   [place], MD   1 year ago Essential hypertension   [place], [place]   1 year ago Essential hypertension   [place], RPH-CPP       Future Appointments             In 2 weeks Charlott Rakes, MD [place]

## 2022-11-02 ENCOUNTER — Other Ambulatory Visit: Payer: Self-pay | Admitting: Family Medicine

## 2022-11-02 DIAGNOSIS — I10 Essential (primary) hypertension: Secondary | ICD-10-CM

## 2022-11-21 ENCOUNTER — Ambulatory Visit: Payer: BC Managed Care – PPO | Admitting: Family Medicine

## 2023-01-22 ENCOUNTER — Other Ambulatory Visit: Payer: Self-pay | Admitting: Family Medicine

## 2023-01-22 DIAGNOSIS — I10 Essential (primary) hypertension: Secondary | ICD-10-CM

## 2023-01-22 NOTE — Telephone Encounter (Signed)
Requested medication (s) are due for refill today: yes  Requested medication (s) are on the active medication list: yes  Last refill:  08/10/22 #270/1  Future visit scheduled: no  Notes to clinic:  pt is due for updated labs, called to schedule, LVMTCB     Requested Prescriptions  Pending Prescriptions Disp Refills   hydrALAZINE (APRESOLINE) 100 MG tablet [Pharmacy Med Name: hydrALAZINE HCl 100 MG Oral Tablet] 270 tablet 0    Sig: TAKE 1/2 (ONE-HALF) TABLET BY MOUTH THREE TIMES DAILY     Cardiovascular:  Vasodilators Failed - 01/22/2023  6:01 AM      Failed - HCT in normal range and within 360 days    Hematocrit  Date Value Ref Range Status  10/25/2021 48.9 37.5 - 51.0 % Final         Failed - HGB in normal range and within 360 days    Hemoglobin  Date Value Ref Range Status  10/25/2021 16.6 13.0 - 17.7 g/dL Final         Failed - RBC in normal range and within 360 days    RBC  Date Value Ref Range Status  10/25/2021 6.04 (H) 4.14 - 5.80 x10E6/uL Final  01/12/2020 5.13 4.22 - 5.81 MIL/uL Final         Failed - WBC in normal range and within 360 days    WBC  Date Value Ref Range Status  10/25/2021 5.2 3.4 - 10.8 x10E3/uL Final  01/12/2020 7.6 4.0 - 10.5 K/uL Final         Failed - PLT in normal range and within 360 days    Platelets  Date Value Ref Range Status  10/25/2021 178 150 - 450 x10E3/uL Final         Failed - ANA Screen, Ifa, Serum in normal range and within 360 days    No results found for: "ANA", "ANATITER", "LABANTI"       Passed - Last BP in normal range    BP Readings from Last 1 Encounters:  08/10/22 126/74         Passed - Valid encounter within last 12 months    Recent Outpatient Visits           5 months ago Tobacco dependence   [place], [place], MD   9 months ago Prediabetes   [place], Charlane Ferretti, MD   12 months ago Candidate for statin therapy  due to risk of future cardiovascular event   [place], [place], MD   1 year ago Essential hypertension   [place], [place], [place]   1 year ago Essential hypertension   [place], RPH-CPP

## 2023-01-22 NOTE — Telephone Encounter (Signed)
Patient called, left VM to return the call to the office to scheduled an appt for medication refill request.   

## 2023-02-26 ENCOUNTER — Other Ambulatory Visit: Payer: Self-pay | Admitting: Family Medicine

## 2023-02-26 DIAGNOSIS — K219 Gastro-esophageal reflux disease without esophagitis: Secondary | ICD-10-CM

## 2023-02-26 DIAGNOSIS — E876 Hypokalemia: Secondary | ICD-10-CM

## 2023-02-26 DIAGNOSIS — I10 Essential (primary) hypertension: Secondary | ICD-10-CM

## 2023-03-14 ENCOUNTER — Other Ambulatory Visit: Payer: Self-pay | Admitting: Family Medicine

## 2023-03-14 DIAGNOSIS — I10 Essential (primary) hypertension: Secondary | ICD-10-CM

## 2023-04-08 ENCOUNTER — Other Ambulatory Visit: Payer: Self-pay | Admitting: Family Medicine

## 2023-04-08 DIAGNOSIS — E876 Hypokalemia: Secondary | ICD-10-CM

## 2023-04-19 ENCOUNTER — Other Ambulatory Visit: Payer: Self-pay | Admitting: Family Medicine

## 2023-04-19 DIAGNOSIS — E876 Hypokalemia: Secondary | ICD-10-CM

## 2023-04-19 DIAGNOSIS — I10 Essential (primary) hypertension: Secondary | ICD-10-CM

## 2023-04-19 DIAGNOSIS — K219 Gastro-esophageal reflux disease without esophagitis: Secondary | ICD-10-CM

## 2023-04-19 NOTE — Telephone Encounter (Signed)
Requested medication (s) are due for refill today:   Yes for all 5  Requested medication (s) are on the active medication list:   Yes for all 5  Future visit scheduled:   No   Been notified to make an appt.   LOV 08/10/2022.   Was a No Show on 11/21/2022   Last ordered: Had 30 day courtesy refills in March 2024 for all rx.  Returned because no response to call in for an appt and has had 30 day courtesy refills already  Provider to review.

## 2023-05-10 ENCOUNTER — Ambulatory Visit: Payer: BC Managed Care – PPO | Attending: Internal Medicine | Admitting: Internal Medicine

## 2023-05-10 ENCOUNTER — Encounter: Payer: Self-pay | Admitting: Internal Medicine

## 2023-05-10 VITALS — BP 177/89 | HR 71 | Temp 98.1°F | Ht 69.0 in | Wt 162.0 lb

## 2023-05-10 DIAGNOSIS — N5201 Erectile dysfunction due to arterial insufficiency: Secondary | ICD-10-CM | POA: Diagnosis not present

## 2023-05-10 DIAGNOSIS — K219 Gastro-esophageal reflux disease without esophagitis: Secondary | ICD-10-CM

## 2023-05-10 DIAGNOSIS — I10 Essential (primary) hypertension: Secondary | ICD-10-CM | POA: Diagnosis not present

## 2023-05-10 DIAGNOSIS — E782 Mixed hyperlipidemia: Secondary | ICD-10-CM | POA: Diagnosis not present

## 2023-05-10 DIAGNOSIS — R7303 Prediabetes: Secondary | ICD-10-CM

## 2023-05-10 MED ORDER — FAMOTIDINE 20 MG PO TABS: 20.0000 mg | ORAL_TABLET | Freq: Every day | ORAL | 0 refills | Status: DC

## 2023-05-10 MED ORDER — LISINOPRIL-HYDROCHLOROTHIAZIDE 20-25 MG PO TABS: 1.0000 | ORAL_TABLET | Freq: Every day | ORAL | 0 refills | Status: DC

## 2023-05-10 MED ORDER — HYDRALAZINE HCL 100 MG PO TABS: ORAL_TABLET | ORAL | 0 refills | Status: DC

## 2023-05-10 MED ORDER — CLONIDINE HCL 0.1 MG PO TABS: 0.1000 mg | ORAL_TABLET | Freq: Three times a day (TID) | ORAL | 0 refills | Status: DC

## 2023-05-10 MED ORDER — ATORVASTATIN CALCIUM 10 MG PO TABS: 10.0000 mg | ORAL_TABLET | Freq: Every day | ORAL | 0 refills | Status: DC

## 2023-05-10 MED ORDER — POTASSIUM CHLORIDE ER 10 MEQ PO TBCR
10.0000 meq | EXTENDED_RELEASE_TABLET | Freq: Every day | ORAL | 0 refills | Status: DC
Start: 2023-05-10 — End: 2023-09-14

## 2023-05-10 NOTE — Progress Notes (Signed)
Patient ID: Frank Warren, male    DOB: [birth year]  MRN: [id number]  CC: Hypertension (HTN f/u./No questions / concerns)   Subjective: Frank Warren is a 62 y.o. male who presents for f/u BP.  PCP is Dr. Alvis Lemmings.  Last seen by her 07/2022 His concerns today include:  hypertension, GERD, nicotine dependence   HYPERTENSION Currently taking: see medication list.  Patient should be on clonidine 0.1 mg 3 times a day, hydralazine 50 mg 3 times a day and lisinopril/HCTZ 20/25 mg once a day. Only took Clonidine this a.m; out of the other 2 meds x 3 days Med Adherence: [x]  Yes - unless he is out of meds    Medication side effects: []  Yes    [x]  No Adherence with salt restriction: []  Yes    [x]  No Home Monitoring?: []  Yes    [x]  No Monitoring Frequency:  Home BP results range:  SOB? []  Yes    [x]  No Chest Pain?: []  Yes    [x]  No Leg swelling?: []  Yes    [x]  No Headaches?: [x]  Yes sometimes Dizziness? []  Yes    [x]  No Comments: Wonders if blood pressure medication is causing him to have erectile dysfunction and wanting to try medication for it if possible. Should also be on atorvastatin 10 mg for hyperlipidemia; out of med x 1 wk.  Last LDL was 111. On Pepcid for acid reflux; out of med.  Doing well with that; heart burn a lot without it. He has history of prediabetes.  Would like to be rechecked.   Patient Active Problem List   Diagnosis Date Noted   Prediabetes 10/25/2021   Hypokalemia 10/25/2021   BMI 25.0-25.9,adult 10/25/2021   Sternal fracture 01/11/2020   Essential hypertension 06/10/2013   Acid reflux disease 06/10/2013   Tobacco dependence 06/10/2013     No current outpatient medications on file prior to visit.   No current facility-administered medications on file prior to visit.    No Known Allergies  Social History   Socioeconomic History   Marital status: Married    Spouse name: Not on file   Number of children: Not on file   Years of education: Not on file    Highest education level: Not on file  Occupational History   Not on file  Tobacco Use   Smoking status: Every Day    Packs/day: .5    Types: Cigarettes   Smokeless tobacco: Never   Tobacco comments:    Smoking 6 cigs/day  Substance and Sexual Activity   Alcohol use: No   Drug use: No   Sexual activity: Not Currently  Other Topics Concern   Not on file  Social History Narrative   Not on file   Social Determinants of Health   Financial Resource Strain: Not on file  Food Insecurity: Not on file  Transportation Needs: Not on file  Physical Activity: Not on file  Stress: Not on file  Social Connections: Not on file  Intimate Partner Violence: Not on file    History reviewed. No pertinent family history.  History reviewed. No pertinent surgical history.  ROS: Review of Systems Negative except as stated above  PHYSICAL EXAM: BP (!) 177/89 (BP Location: Left Arm, Patient Position: Sitting, Cuff Size: Normal)   Pulse 71   Temp 98.1 F (36.7 C) (Oral)   Ht 5\' 9"  (1.753 m)   Wt 162 lb (73.5 kg)   SpO2 99%   BMI 23.92 kg/m   Physical  Exam BP 175/85  General appearance - alert, well appearing, older African male and in no distress Mental status - normal mood, behavior, speech, dress, motor activity, and thought processes Neck - supple, no significant adenopathy Chest - clear to auscultation, no wheezes, rales or rhonchi, symmetric air entry Heart - normal rate, regular rhythm, normal S1, S2, no murmurs, rubs, clicks or gallops Extremities - peripheral pulses normal, no pedal edema, no clubbing or cyanosis     Latest Ref Rng & Units 04/20/2022    9:43 AM 10/25/2021    8:37 AM 03/23/2021   12:13 PM  CMP  Glucose 70 - 99 mg/dL 098  119  147   BUN 8 - 27 mg/dL 11  11  13    Creatinine 0.76 - 1.27 mg/dL 8.29  5.62  1.30   Sodium 134 - 144 mmol/L 145  138  141   Potassium 3.5 - 5.2 mmol/L 3.6  3.4  3.2   Chloride 96 - 106 mmol/L 97  96  98   CO2 20 - 29 mmol/L 26   24    Calcium 8.6 - 10.2 mg/dL 9.7  9.4  9.5   Total Protein 6.0 - 8.5 g/dL  7.0    Total Bilirubin 0.0 - 1.2 mg/dL  0.6    Alkaline Phos 44 - 121 IU/L  144    AST 0 - 40 IU/L  17     Lipid Panel     Component Value Date/Time   CHOL 174 10/25/2021 0837   TRIG 99 10/25/2021 0837   HDL 45 10/25/2021 0837   CHOLHDL 3.9 10/25/2021 0837   CHOLHDL 3.7 09/22/2016 1356   VLDL 16 09/22/2016 1356   LDLCALC 111 (H) 10/25/2021 0837    CBC    Component Value Date/Time   WBC 5.2 10/25/2021 0837   WBC 7.6 01/12/2020 0453   RBC 6.04 (H) 10/25/2021 0837   RBC 5.13 01/12/2020 0453   HGB 16.6 10/25/2021 0837   HCT 48.9 10/25/2021 0837   PLT 178 10/25/2021 0837   MCV 81 10/25/2021 0837   MCH 27.5 10/25/2021 0837   MCH 27.9 01/12/2020 0453   MCHC 33.9 10/25/2021 0837   MCHC 33.2 01/12/2020 0453   RDW 13.4 10/25/2021 0837   LYMPHSABS 1.8 10/25/2021 0837   MONOABS 0.4 01/11/2020 2046   EOSABS 0.4 10/25/2021 0837   BASOSABS 0.0 10/25/2021 0837    ASSESSMENT AND PLAN:  1. Essential hypertension Not at goal.  DASH diet discussed and encouraged.   He has been out of 2 of his blood pressure medications.  Refills sent to the pharmacy.  Discussed importance of good blood pressure control to prevent cardiovascular events. - cloNIDine (CATAPRES) 0.1 MG tablet; Take 1 tablet (0.1 mg total) by mouth 3 (three) times daily.  Dispense: 270 tablet; Refill: 0 - hydrALAZINE (APRESOLINE) 100 MG tablet; TAKE 1/2 (ONE-HALF) TABLET BY MOUTH THREE TIMES DAILY .  Dispense: 90 tablet; Refill: 0 - lisinopril-hydrochlorothiazide (ZESTORETIC) 20-25 MG tablet; Take 1 tablet by mouth daily.  Dispense: 90 tablet; Refill: 0 - potassium chloride (KLOR-CON) 10 MEQ tablet; Take 1 tablet (10 mEq total) by mouth daily.  Dispense: 90 tablet; Refill: 0 - CBC - Comprehensive metabolic panel  2. Gastroesophageal reflux disease without esophagitis Doing well on Pepcid.  Refill sent. - famotidine (PEPCID) 20 MG tablet; Take 1  tablet (20 mg total) by mouth daily.  Dispense: 90 tablet; Refill: 0  3. Mixed hyperlipidemia - atorvastatin (LIPITOR) 10 MG tablet; Take 1  tablet (10 mg total) by mouth daily.  Dispense: 90 tablet; Refill: 0 - Lipid panel  4. Erectile dysfunction due to arterial insufficiency Advised that hypertension and blood pressure medications can cause or contribute to ED.  Discussed trying him with Viagra but patient did not like possible side effects.  He declined referral to urology to discuss other options.  5. Prediabetes Discussed on encourage healthy eating habits - Hemoglobin A1c    Patient was given the opportunity to ask questions.  Patient verbalized understanding of the plan and was able to repeat key elements of the plan.   This documentation was completed using Paediatric nurse.  Any transcriptional errors are unintentional.  Orders Placed This Encounter  Procedures   CBC   Comprehensive metabolic panel   Lipid panel   Hemoglobin A1c     Requested Prescriptions   Signed Prescriptions Disp Refills   atorvastatin (LIPITOR) 10 MG tablet 90 tablet 0    Sig: Take 1 tablet (10 mg total) by mouth daily.   cloNIDine (CATAPRES) 0.1 MG tablet 270 tablet 0    Sig: Take 1 tablet (0.1 mg total) by mouth 3 (three) times daily.   famotidine (PEPCID) 20 MG tablet 90 tablet 0    Sig: Take 1 tablet (20 mg total) by mouth daily.   hydrALAZINE (APRESOLINE) 100 MG tablet 90 tablet 0    Sig: TAKE 1/2 (ONE-HALF) TABLET BY MOUTH THREE TIMES DAILY .   lisinopril-hydrochlorothiazide (ZESTORETIC) 20-25 MG tablet 90 tablet 0    Sig: Take 1 tablet by mouth daily.   potassium chloride (KLOR-CON) 10 MEQ tablet 90 tablet 0    Sig: Take 1 tablet (10 mEq total) by mouth daily.    Return in about 3 months (around 08/10/2023) for Give f/u with Dr. Alvis Lemmings in 3 mths.  Jonah Blue, MD, FACP

## 2023-05-11 LAB — COMPREHENSIVE METABOLIC PANEL
ALT: 14 IU/L (ref 0–44)
AST: 17 IU/L (ref 0–40)
Albumin/Globulin Ratio: 1.4 (ref 1.2–2.2)
Albumin: 4 g/dL (ref 3.9–4.9)
Alkaline Phosphatase: 133 IU/L — ABNORMAL HIGH (ref 44–121)
BUN/Creatinine Ratio: 12 (ref 10–24)
BUN: 11 mg/dL (ref 8–27)
Bilirubin Total: 0.3 mg/dL (ref 0.0–1.2)
CO2: 25 mmol/L (ref 20–29)
Calcium: 9.5 mg/dL (ref 8.6–10.2)
Chloride: 100 mmol/L (ref 96–106)
Creatinine, Ser: 0.91 mg/dL (ref 0.76–1.27)
Globulin, Total: 2.9 g/dL (ref 1.5–4.5)
Glucose: 137 mg/dL — ABNORMAL HIGH (ref 70–99)
Potassium: 3.4 mmol/L — ABNORMAL LOW (ref 3.5–5.2)
Sodium: 140 mmol/L (ref 134–144)
Total Protein: 6.9 g/dL (ref 6.0–8.5)
eGFR: 95 mL/min/{1.73_m2} (ref >59)

## 2023-05-11 LAB — LIPID PANEL
Chol/HDL Ratio: 2.5 ratio (ref 0.0–5.0)
Cholesterol, Total: 117 mg/dL (ref 100–199)
HDL: 47 mg/dL (ref >39)
LDL Chol Calc (NIH): 55 mg/dL (ref 0–99)
Triglycerides: 72 mg/dL (ref 0–149)
VLDL Cholesterol Cal: 15 mg/dL (ref 5–40)

## 2023-05-11 LAB — HEMOGLOBIN A1C
Est. average glucose Bld gHb Est-mCnc: 126 mg/dL
Hgb A1c MFr Bld: 6 % — ABNORMAL HIGH (ref 4.8–5.6)

## 2023-05-11 LAB — CBC
Hematocrit: 44.4 % (ref 37.5–51.0)
Hemoglobin: 14.8 g/dL (ref 13.0–17.7)
MCH: 27.3 pg (ref 26.6–33.0)
MCHC: 33.3 g/dL (ref 31.5–35.7)
MCV: 82 fL (ref 79–97)
Platelets: 144 10*3/uL — ABNORMAL LOW (ref 150–450)
RBC: 5.42 x10E6/uL (ref 4.14–5.80)
RDW: 13.6 % (ref 11.6–15.4)
WBC: 5.2 10*3/uL (ref 3.4–10.8)

## 2023-08-14 ENCOUNTER — Other Ambulatory Visit: Payer: Self-pay | Admitting: Internal Medicine

## 2023-08-14 DIAGNOSIS — I10 Essential (primary) hypertension: Secondary | ICD-10-CM

## 2023-08-17 ENCOUNTER — Telehealth: Payer: Self-pay

## 2023-08-17 NOTE — Progress Notes (Signed)
Patient attempted to be outreached by Mack Guise on 08/17/23 to discuss hypertension. Left voicemail for patient to return our call at their convenience at [phone].  Mack Guise, Student-PharmD

## 2023-08-21 ENCOUNTER — Ambulatory Visit: Payer: BC Managed Care – PPO | Admitting: Family Medicine

## 2023-09-06 ENCOUNTER — Ambulatory Visit: Payer: BC Managed Care – PPO | Admitting: Physician Assistant

## 2023-09-12 ENCOUNTER — Encounter: Payer: Self-pay | Admitting: Pharmacist

## 2023-09-13 ENCOUNTER — Other Ambulatory Visit: Payer: Self-pay | Admitting: Internal Medicine

## 2023-09-13 DIAGNOSIS — I10 Essential (primary) hypertension: Secondary | ICD-10-CM

## 2023-09-14 ENCOUNTER — Other Ambulatory Visit: Payer: Self-pay | Admitting: Internal Medicine

## 2023-09-14 ENCOUNTER — Telehealth: Payer: Self-pay

## 2023-09-14 ENCOUNTER — Other Ambulatory Visit: Payer: Self-pay | Admitting: Family Medicine

## 2023-09-14 DIAGNOSIS — I10 Essential (primary) hypertension: Secondary | ICD-10-CM

## 2023-09-14 DIAGNOSIS — K219 Gastro-esophageal reflux disease without esophagitis: Secondary | ICD-10-CM

## 2023-09-14 DIAGNOSIS — E782 Mixed hyperlipidemia: Secondary | ICD-10-CM

## 2023-09-14 NOTE — Progress Notes (Signed)
Patient attempted to be outreached by Sofie Rower, PharmD to discuss hypertension. Unable to leave voicemail due to full mailbox.   Sofie Rower, PharmD [employer] PGY1

## 2023-09-18 ENCOUNTER — Encounter: Payer: Self-pay | Admitting: Family Medicine

## 2023-09-18 ENCOUNTER — Ambulatory Visit: Payer: BC Managed Care – PPO | Attending: Family Medicine | Admitting: Family Medicine

## 2023-09-18 VITALS — BP 125/72 | HR 81 | Ht 69.0 in | Wt 166.2 lb

## 2023-09-18 DIAGNOSIS — R7303 Prediabetes: Secondary | ICD-10-CM

## 2023-09-18 DIAGNOSIS — F17200 Nicotine dependence, unspecified, uncomplicated: Secondary | ICD-10-CM

## 2023-09-18 DIAGNOSIS — N529 Male erectile dysfunction, unspecified: Secondary | ICD-10-CM | POA: Diagnosis not present

## 2023-09-18 DIAGNOSIS — Z125 Encounter for screening for malignant neoplasm of prostate: Secondary | ICD-10-CM

## 2023-09-18 DIAGNOSIS — E782 Mixed hyperlipidemia: Secondary | ICD-10-CM

## 2023-09-18 DIAGNOSIS — I10 Essential (primary) hypertension: Secondary | ICD-10-CM | POA: Diagnosis not present

## 2023-09-18 DIAGNOSIS — K219 Gastro-esophageal reflux disease without esophagitis: Secondary | ICD-10-CM

## 2023-09-18 DIAGNOSIS — E876 Hypokalemia: Secondary | ICD-10-CM

## 2023-09-18 MED ORDER — CLONIDINE HCL 0.1 MG PO TABS
0.1000 mg | ORAL_TABLET | Freq: Three times a day (TID) | ORAL | 0 refills | Status: DC
Start: 2023-09-18 — End: 2023-11-12

## 2023-09-18 MED ORDER — ATORVASTATIN CALCIUM 10 MG PO TABS
10.0000 mg | ORAL_TABLET | Freq: Every day | ORAL | 0 refills | Status: DC
Start: 2023-09-18 — End: 2023-11-12

## 2023-09-18 MED ORDER — LISINOPRIL-HYDROCHLOROTHIAZIDE 20-25 MG PO TABS
1.0000 | ORAL_TABLET | Freq: Every day | ORAL | 0 refills | Status: DC
Start: 2023-09-18 — End: 2023-12-04

## 2023-09-18 MED ORDER — FAMOTIDINE 20 MG PO TABS
20.0000 mg | ORAL_TABLET | Freq: Every day | ORAL | 0 refills | Status: DC
Start: 2023-09-18 — End: 2023-11-12

## 2023-09-18 MED ORDER — HYDRALAZINE HCL 100 MG PO TABS
ORAL_TABLET | ORAL | 0 refills | Status: DC
Start: 2023-09-18 — End: 2023-12-04

## 2023-09-18 MED ORDER — TADALAFIL 10 MG PO TABS: 10.0000 mg | ORAL_TABLET | ORAL | 1 refills | Status: DC | PRN

## 2023-09-18 MED ORDER — POTASSIUM CHLORIDE ER 10 MEQ PO TBCR
10.0000 meq | EXTENDED_RELEASE_TABLET | Freq: Every day | ORAL | 0 refills | Status: DC
Start: 2023-09-18 — End: 2023-11-12

## 2023-09-18 NOTE — Patient Instructions (Signed)
Colorectal Cancer Screening  Colorectal cancer screening is a group of tests that are used to check for colorectal cancer before symptoms develop. Colorectal refers to the colon and rectum. The colon and rectum are located at the end of the digestive tract and carry stool (feces) out of the body. Who should have screening? All adults who are 81-62 years old should have screening. Your health care provider may recommend screening before age 66. You will have tests every 1-10 years, depending on your results and the type of screening test. Screening recommendations for adults who are 58-22 years old vary depending on a person's health. People older than age 39 should no longer get colorectal cancer screening. You may have screening tests starting before age 56, or more often than other people, if you have any of these risk factors: A personal or family history of colorectal cancer or abnormal growths known as polyps in your colon. Inflammatory bowel disease, such as ulcerative colitis or Crohn's disease. A history of having radiation treatment to the abdomen or the area between the hip bones (pelvic area) for cancer. A type of genetic syndrome that is passed from parent to child (hereditary), such as: Lynch syndrome. Familial adenomatous polyposis. Turcot syndrome. Peutz-Jeghers syndrome. MUTYH-associated polyposis (MAP). A personal history of diabetes. Types of tests There are several types of colorectal screening tests. You may have one or more of the following: Guaiac-based fecal occult blood testing. For this test, a stool sample is checked for hidden (occult) blood, which could be a sign of colorectal cancer. Fecal immunochemical test (FIT). For this test, a stool sample is checked for blood, which could be a sign of colorectal cancer. Stool DNA test. For this test, a stool sample is checked for blood and changes in DNA that could lead to colorectal cancer. Sigmoidoscopy. During this test, a  thin, flexible tube with a camera on the end, called a sigmoidoscope, is used to examine the rectum and the lower colon. Colonoscopy. During this test, a long, flexible tube with a camera on the end, called a colonoscope, is used to examine the entire colon and rectum. Also, sometimes a tissue sample is taken to be looked at under a microscope (biopsy) or small polyps are removed during this test. Virtual colonoscopy. Instead of a colonoscope, this type of colonoscopy uses a CT scan to take pictures of the colon and rectum. A CT scan is a type of X-ray that is made using computers. What are the benefits of screening? Screening reduces your risk for colorectal cancer and can help identify cancer at an early stage, when the cancer can be removed or treated more easily. It is common for polyps to form in the lining of the colon, especially as you age. These polyps may be cancerous or become cancerous over time. Screening can identify these polyps. What are the risks of screening? Generally, these are safe tests. However, problems may occur, including: The need for more tests to confirm results from a stool sample test. Stool sample tests have fewer risks than other types of screening tests. Being exposed to low levels of radiation, if you had a test involving X-rays. This may slightly increase your cancer risk. The benefit of detecting cancer outweighs the slight increase in risk. Bleeding, damage to the intestine, or infection caused by a sigmoidoscopy or colonoscopy. A reaction to medicines given during a sigmoidoscopy or colonoscopy. Talk with your health care provider to understand your risk for colorectal cancer and to make a  screening plan that is right for you. Questions to ask your health care provider When should I start colorectal cancer screening? What is my risk for colorectal cancer? How often do I need screening? Which screening tests do I need? How do I get my test results? What do my  results mean? Where to find more information Learn more about colorectal cancer screening from: The American Cancer Society: cancer.org National Cancer Institute: cancer.gov Summary Colorectal cancer screening is a group of tests used to check for colorectal cancer before symptoms develop. All adults who are 89-57 years old should have screening. Your health care provider may recommend screening before age 57. You may have screening tests starting before age 64, or more often than other people, if you have certain risk factors. Screening reduces your risk for colorectal cancer and can help identify cancer at an early stage, when the cancer can be removed or treated more easily. Talk with your health care provider to understand your risk for colorectal cancer and to make a screening plan that is right for you. This information is not intended to replace advice given to you by your health care provider. Make sure you discuss any questions you have with your health care provider. Document Revised: 03/31/2020 Document Reviewed: 03/31/2020 Elsevier Patient Education  2024 [employer].

## 2023-09-18 NOTE — Progress Notes (Signed)
Subjective:  Patient ID: Frank Warren, male    DOB: [birth year]  Age: 62 y.o. MRN: [id number]  CC: Medical Management of Chronic Issues   HPI Frank Warren is a 62 y.o. year old male with a history of hypertension, GERD, nicotine dependence who presents today for follow-up visit.     Interval History: Discussed the use of AI scribe software for clinical note transcription with the patient, who gave verbal consent to proceed.   He is currently taking lisinopril, clonidine, hydralazine, atorvastatin, and potassium tablets. He also takes famotidine for acid reflux. His blood pressure is well controlled on this regimen. His last potassium level in May was low at 3.4, and he was found to have prediabetes with an elevated A1c of 6.0.  The patient also reports problems with erectile dysfunction and is interested in trying Cialis. He has a history of smoking and is considering quitting but has not yet made a decision. He reports no exercise and works long hours standing, which sometimes results in ankle swelling. He has not yet decided to undergo colonoscopy for cancer screening and did not complete a previously sent home stool card test.        Past Medical History:  Diagnosis Date   Acid reflux disease    Dyslipidemia    Hyperlipidemia    Hypertension    Tobacco abuse     No past surgical history on file.  No family history on file.  Social History   Socioeconomic History   Marital status: Married    Spouse name: Not on file   Number of children: Not on file   Years of education: Not on file   Highest education level: Not on file  Occupational History   Not on file  Tobacco Use   Smoking status: Every Day    Current packs/day: 0.50    Types: Cigarettes   Smokeless tobacco: Never   Tobacco comments:    Smoking 6 cigs/day  Substance and Sexual Activity   Alcohol use: No   Drug use: No   Sexual activity: Not Currently  Other Topics Concern   Not on file  Social History  Narrative   Not on file   Social Determinants of Health   Financial Resource Strain: Not on file  Food Insecurity: Not on file  Transportation Needs: Not on file  Physical Activity: Not on file  Stress: Not on file  Social Connections: Not on file    No Known Allergies  Outpatient Medications Prior to Visit  Medication Sig Dispense Refill   atorvastatin (LIPITOR) 10 MG tablet Take 1 tablet by mouth once daily 30 tablet 0   cloNIDine (CATAPRES) 0.1 MG tablet TAKE 1 TABLET BY MOUTH THREE TIMES DAILY 90 tablet 0   famotidine (PEPCID) 20 MG tablet Take 1 tablet by mouth once daily 30 tablet 0   hydrALAZINE (APRESOLINE) 100 MG tablet TAKE 1/2 (ONE-HALF) TABLET BY MOUTH THREE TIMES DAILY 45 tablet 0   lisinopril-hydrochlorothiazide (ZESTORETIC) 20-25 MG tablet Take 1 tablet by mouth once daily 30 tablet 0   potassium chloride (KLOR-CON) 10 MEQ tablet Take 1 tablet by mouth once daily 30 tablet 0   No facility-administered medications prior to visit.     ROS Review of Systems  Constitutional:  Negative for activity change and appetite change.  HENT:  Negative for sinus pressure and sore throat.   Respiratory:  Negative for chest tightness, shortness of breath and wheezing.   Cardiovascular:  Positive for  leg swelling. Negative for chest pain and palpitations.  Gastrointestinal:  Negative for abdominal distention, abdominal pain and constipation.  Genitourinary: Negative.   Musculoskeletal: Negative.   Psychiatric/Behavioral:  Negative for behavioral problems and dysphoric mood.     Objective:  BP 125/72   Pulse 81   Ht 5\' 9"  (1.753 m)   Wt 166 lb 3.2 oz (75.4 kg)   SpO2 98%   BMI 24.54 kg/m      09/18/2023    3:16 PM 05/10/2023    8:40 AM 08/10/2022   10:49 AM  BP/Weight  Systolic BP 125 177 126  Diastolic BP 72 89 74  Wt. (Lbs) 166.2 162 161.8  BMI 24.54 kg/m2 23.92 kg/m2 23.89 kg/m2      Physical Exam Constitutional:      Appearance: He is well-developed.   Cardiovascular:     Rate and Rhythm: Normal rate.     Heart sounds: Normal heart sounds. No murmur heard. Pulmonary:     Effort: Pulmonary effort is normal.     Breath sounds: Normal breath sounds. No wheezing or rales.  Chest:     Chest wall: No tenderness.  Abdominal:     General: Bowel sounds are normal. There is no distension.     Palpations: Abdomen is soft. There is no mass.     Tenderness: There is no abdominal tenderness.  Musculoskeletal:        General: Normal range of motion.     Right lower leg: No edema.     Left lower leg: No edema.  Neurological:     Mental Status: He is alert and oriented to person, place, and time.  Psychiatric:        Mood and Affect: Mood normal.        Latest Ref Rng & Units 05/10/2023   10:16 AM 04/20/2022    9:43 AM 10/25/2021    8:37 AM  CMP  Glucose 70 - 99 mg/dL 956  213  086   BUN 8 - 27 mg/dL 11  11  11    Creatinine 0.76 - 1.27 mg/dL 5.78  4.69  6.29   Sodium 134 - 144 mmol/L 140  145  138   Potassium 3.5 - 5.2 mmol/L 3.4  3.6  3.4   Chloride 96 - 106 mmol/L 100  97  96   CO2 20 - 29 mmol/L 25  26    Calcium 8.6 - 10.2 mg/dL 9.5  9.7  9.4   Total Protein 6.0 - 8.5 g/dL 6.9   7.0   Total Bilirubin 0.0 - 1.2 mg/dL 0.3   0.6   Alkaline Phos 44 - 121 IU/L 133   144   AST 0 - 40 IU/L 17   17   ALT 0 - 44 IU/L 14       Lipid Panel     Component Value Date/Time   CHOL 117 05/10/2023 1016   TRIG 72 05/10/2023 1016   HDL 47 05/10/2023 1016   CHOLHDL 2.5 05/10/2023 1016   CHOLHDL 3.7 09/22/2016 1356   VLDL 16 09/22/2016 1356   LDLCALC 55 05/10/2023 1016    CBC    Component Value Date/Time   WBC 5.2 05/10/2023 1016   WBC 7.6 01/12/2020 0453   RBC 5.42 05/10/2023 1016   RBC 5.13 01/12/2020 0453   HGB 14.8 05/10/2023 1016   HCT 44.4 05/10/2023 1016   PLT 144 (L) 05/10/2023 1016   MCV 82 05/10/2023 1016   MCH 27.3 05/10/2023  1016   MCH 27.9 01/12/2020 0453   MCHC 33.3 05/10/2023 1016   MCHC 33.2 01/12/2020 0453   RDW  13.6 05/10/2023 1016   LYMPHSABS 1.8 10/25/2021 0837   MONOABS 0.4 01/11/2020 2046   EOSABS 0.4 10/25/2021 0837   BASOSABS 0.0 10/25/2021 6213    Lab Results  Component Value Date   HGBA1C 6.0 (H) 05/10/2023    Assessment & Plan:      Hypertension Well controlled on Lisinopril, Clonidine, and Hydralazine. -Continue current medications. -Counseled on blood pressure goal of less than 130/80, low-sodium, DASH diet, medication compliance, 150 minutes of moderate intensity exercise per week. Discussed medication compliance, adverse effects.   Hyperlipidemia On Atorvastatin. -Continue Atorvastatin.  Gastroesophageal Reflux Disease -Continue Famotifine  Hypokalemia On Potassium tablets. -Check potassium level today.  Prediabetes Last A1C in May indicated prediabetes at 6.0 -Check A1C today.  Erectile Dysfunction -Prescribe Cialis pressure medications.  Tobacco Use Continues to smoke. -Encouraged to quit smoking; he is not  ready to quit  Colon Cancer Screening Has not completed colonoscopy or stool card test. -Discussed options for colonoscopy or stool card test. Patient to consider.  Lower Extremity Edema Reports swelling with prolonged standing. -Recommended compression stockings and leg elevation at home. -Recommended compression stockings and leg elevation for lower extremity edema.          Meds ordered this encounter  Medications   atorvastatin (LIPITOR) 10 MG tablet    Sig: Take 1 tablet (10 mg total) by mouth daily.    Dispense:  30 tablet    Refill:  0   cloNIDine (CATAPRES) 0.1 MG tablet    Sig: Take 1 tablet (0.1 mg total) by mouth 3 (three) times daily.    Dispense:  90 tablet    Refill:  0   famotidine (PEPCID) 20 MG tablet    Sig: Take 1 tablet (20 mg total) by mouth daily.    Dispense:  30 tablet    Refill:  0   hydrALAZINE (APRESOLINE) 100 MG tablet    Sig: TAKE 1/2 (ONE-HALF) TABLET BY MOUTH THREE TIMES DAILY .    Dispense:  45  tablet    Refill:  0    Please keep upcoming appt.   lisinopril-hydrochlorothiazide (ZESTORETIC) 20-25 MG tablet    Sig: Take 1 tablet by mouth daily.    Dispense:  30 tablet    Refill:  0    Must have office visit for refills   potassium chloride (KLOR-CON) 10 MEQ tablet    Sig: Take 1 tablet (10 mEq total) by mouth daily.    Dispense:  30 tablet    Refill:  0   tadalafil (CIALIS) 10 MG tablet    Sig: Take 1 tablet (10 mg total) by mouth every other day as needed for erectile dysfunction.    Dispense:  10 tablet    Refill:  1    Follow-up: Return in about 6 months (around 03/17/2024) for Chronic medical conditions.       Hoy Register, MD, FAAFP. [facility] [place], [place] [phone]   09/18/2023, 5:09 PM

## 2023-09-19 LAB — PSA, TOTAL AND FREE
PSA, Free Pct: 32.9 %
PSA, Free: 0.23 ng/mL
Prostate Specific Ag, Serum: 0.7 ng/mL (ref 0.0–4.0)

## 2023-09-19 LAB — BASIC METABOLIC PANEL
BUN/Creatinine Ratio: 10 (ref 10–24)
BUN: 9 mg/dL (ref 8–27)
CO2: 28 mmol/L (ref 20–29)
Calcium: 9.4 mg/dL (ref 8.6–10.2)
Chloride: 97 mmol/L (ref 96–106)
Creatinine, Ser: 0.86 mg/dL (ref 0.76–1.27)
Glucose: 126 mg/dL — ABNORMAL HIGH (ref 70–99)
Potassium: 3.6 mmol/L (ref 3.5–5.2)
Sodium: 137 mmol/L (ref 134–144)
eGFR: 98 mL/min/{1.73_m2} (ref >59)

## 2023-09-28 ENCOUNTER — Other Ambulatory Visit: Payer: Self-pay | Admitting: Family Medicine

## 2023-09-28 DIAGNOSIS — Z1211 Encounter for screening for malignant neoplasm of colon: Secondary | ICD-10-CM

## 2023-09-28 DIAGNOSIS — Z1212 Encounter for screening for malignant neoplasm of rectum: Secondary | ICD-10-CM

## 2023-11-10 ENCOUNTER — Other Ambulatory Visit: Payer: Self-pay | Admitting: Family Medicine

## 2023-11-10 DIAGNOSIS — I10 Essential (primary) hypertension: Secondary | ICD-10-CM

## 2023-11-10 DIAGNOSIS — K219 Gastro-esophageal reflux disease without esophagitis: Secondary | ICD-10-CM

## 2023-11-10 DIAGNOSIS — E782 Mixed hyperlipidemia: Secondary | ICD-10-CM

## 2023-11-12 NOTE — Telephone Encounter (Signed)
Requested Prescriptions  Pending Prescriptions Disp Refills   atorvastatin (LIPITOR) 10 MG tablet [Pharmacy Med Name: Atorvastatin Calcium 10 MG Oral Tablet] 90 tablet 1    Sig: Take 1 tablet by mouth once daily     Cardiovascular:  Antilipid - Statins Failed - 11/10/2023  6:01 AM      Failed - Lipid Panel in normal range within the last 12 months    Cholesterol, Total  Date Value Ref Range Status  05/10/2023 117 100 - 199 mg/dL Final   LDL Chol Calc (NIH)  Date Value Ref Range Status  05/10/2023 55 0 - 99 mg/dL Final   HDL  Date Value Ref Range Status  05/10/2023 47 >39 mg/dL Final   Triglycerides  Date Value Ref Range Status  05/10/2023 72 0 - 149 mg/dL Final         Passed - Patient is not pregnant      Passed - Valid encounter within last 12 months    Recent Outpatient Visits           1 month ago Erectile dysfunction, unspecified erectile dysfunction type   [facility] [facility] [place]. [facility] Hoy Register, MD   6 months ago Essential hypertension   [place] [facility] Dept Of [place]. [facility] Marcine Matar, MD   1 year ago Tobacco dependence   [place] [facility] [place] - [facility] Dept Of [place]. [facility] Hoy Register, MD   1 year ago Prediabetes   [place] [facility] - A Dept Of [place]. [facility] Hoy Register, MD   1 year ago Candidate for statin therapy due to risk of future cardiovascular event   [facility] - A Dept Of [place]. [facility] Hoy Register, MD       Future Appointments             In 4 months Hoy Register, MD [facility] [place] - A Dept Of [place]. [facility]             cloNIDine (CATAPRES) 0.1 MG tablet [Pharmacy Med Name: cloNIDine HCl 0.1 MG Oral Tablet] 270 tablet 1    Sig: TAKE 1 TABLET BY MOUTH THREE TIMES DAILY     Cardiovascular:  Alpha-2  Agonists Passed - 11/10/2023  6:01 AM      Passed - Last BP in normal range    BP Readings from Last 1 Encounters:  09/18/23 125/72         Passed - Last Heart Rate in normal range    Pulse Readings from Last 1 Encounters:  09/18/23 81         Passed - Valid encounter within last 6 months    Recent Outpatient Visits           1 month ago Erectile dysfunction, unspecified erectile dysfunction type   [facility] [facility] Dept Of [place]. [facility] Hoy Register, MD   6 months ago Essential hypertension   [place] [facility] Dept Of [place]. [facility] Marcine Matar, MD   1 year ago Tobacco dependence   [place] [facility] - A Dept Of . [facility] Hoy Register, MD   1 year ago Prediabetes    [facility] [place] - A  Dept Of [place]. [facility] Hoy Register, MD   1 year ago Candidate for statin therapy due to risk of future cardiovascular event   [facility] [facility] - A Dept Of [place]. [facility] Hoy Register, MD       Future Appointments             In 4 months Hoy Register, MD [facility] [place] - A Dept Of [place]. [facility]             famotidine (PEPCID) 20 MG tablet [Pharmacy Med Name: Famotidine 20 MG Oral Tablet] 90 tablet 1    Sig: Take 1 tablet by mouth once daily     Gastroenterology:  H2 Antagonists Passed - 11/10/2023  6:01 AM      Passed - Valid encounter within last 12 months    Recent Outpatient Visits           1 month ago Erectile dysfunction, unspecified erectile dysfunction type   [facility] [facility] Dept Of [place]. [facility] Hoy Register, MD   6 months ago Essential hypertension   [place] [facility] [place] - [facility] Dept Of [place]. [facility] Marcine Matar, MD   1 year ago Tobacco dependence   [place] [facility]  [place] - A Dept Of [place]. [facility] Hoy Register, MD   1 year ago Prediabetes   [place] [facility] [place] - A Dept Of [place]. [facility] Hoy Register, MD   1 year ago Candidate for statin therapy due to risk of future cardiovascular event   [facility] [facility] - A Dept Of [place]. [facility] Hoy Register, MD       Future Appointments             In 4 months Hoy Register, MD [facility] [place] - A Dept Of . [place] [facility]             potassium chloride (KLOR-CON) 10 MEQ tablet [Pharmacy Med Name: Potassium Chloride ER 10 MEQ Oral Tablet Extended Release] 90 tablet 1    Sig: Take 1 tablet by mouth once daily     Endocrinology:  Minerals - Potassium Supplementation Passed - 11/10/2023  6:01 AM      Passed - K in normal range and within 360 days    Potassium  Date Value Ref Range Status  09/18/2023 3.6 3.5 - 5.2 mmol/L Final         Passed - Cr in normal range and within 360 days    Creat  Date Value Ref Range Status  09/22/2016 0.83 0.70 - 1.33 mg/dL Final    Comment:      For patients > or = 62 years of age: The upper reference limit for Creatinine is approximately 13% higher for people identified as African-American.      Creatinine, Ser  Date Value Ref Range Status  09/18/2023 0.86 0.76 - 1.27 mg/dL Final         Passed - Valid encounter within last 12 months    Recent Outpatient Visits           1 month ago Erectile dysfunction, unspecified erectile dysfunction type   [facility] - [facility] Dept Of . [facility] Hoy Register, MD   6 months ago Essential hypertension  [place] [facility] - [facility] Dept Of [place]. [facility] Marcine Matar, MD   1 year ago Tobacco dependence   [place] [facility] [place] - A Dept Of [place]. [facility] Hoy Register, MD   1 year ago Prediabetes   [facility] - A Dept Of [place]. [facility] Hoy Register, MD   1 year ago Candidate for statin therapy due to risk of future cardiovascular event   [place] [facility] - A Dept Of [place]. [facility] Hoy Register, MD       Future Appointments             In 4 months Hoy Register, MD [facility] [place] - A Dept Of [place]. [facility]

## 2023-12-02 ENCOUNTER — Other Ambulatory Visit: Payer: Self-pay | Admitting: Family Medicine

## 2023-12-02 DIAGNOSIS — I10 Essential (primary) hypertension: Secondary | ICD-10-CM

## 2024-02-25 ENCOUNTER — Other Ambulatory Visit: Payer: Self-pay | Admitting: Family Medicine

## 2024-02-25 DIAGNOSIS — I10 Essential (primary) hypertension: Secondary | ICD-10-CM

## 2024-03-13 ENCOUNTER — Telehealth: Payer: Self-pay | Admitting: Family Medicine

## 2024-03-13 NOTE — Telephone Encounter (Signed)
 Contacted pt left vm to confirmed appt

## 2024-03-18 ENCOUNTER — Encounter: Payer: Self-pay | Admitting: Family Medicine

## 2024-03-18 ENCOUNTER — Ambulatory Visit: Payer: BC Managed Care – PPO | Attending: Family Medicine | Admitting: Family Medicine

## 2024-03-18 VITALS — BP 177/95 | HR 60 | Ht 69.0 in | Wt 165.0 lb

## 2024-03-18 DIAGNOSIS — E782 Mixed hyperlipidemia: Secondary | ICD-10-CM

## 2024-03-18 DIAGNOSIS — E876 Hypokalemia: Secondary | ICD-10-CM | POA: Diagnosis not present

## 2024-03-18 DIAGNOSIS — N529 Male erectile dysfunction, unspecified: Secondary | ICD-10-CM

## 2024-03-18 DIAGNOSIS — F17210 Nicotine dependence, cigarettes, uncomplicated: Secondary | ICD-10-CM

## 2024-03-18 DIAGNOSIS — I10 Essential (primary) hypertension: Secondary | ICD-10-CM

## 2024-03-18 DIAGNOSIS — R7303 Prediabetes: Secondary | ICD-10-CM

## 2024-03-18 LAB — POCT GLYCOSYLATED HEMOGLOBIN (HGB A1C): HbA1c, POC (prediabetic range): 6.2 % (ref 5.7–6.4)

## 2024-03-18 MED ORDER — HYDRALAZINE HCL 100 MG PO TABS: ORAL_TABLET | ORAL | 1 refills | Status: DC

## 2024-03-18 MED ORDER — TADALAFIL 10 MG PO TABS: 10.0000 mg | ORAL_TABLET | ORAL | 1 refills | Status: DC | PRN

## 2024-03-18 MED ORDER — POTASSIUM CHLORIDE ER 10 MEQ PO TBCR: 10.0000 meq | EXTENDED_RELEASE_TABLET | Freq: Every day | ORAL | 1 refills | Status: DC

## 2024-03-18 MED ORDER — CLONIDINE HCL 0.1 MG PO TABS: 0.1000 mg | ORAL_TABLET | Freq: Three times a day (TID) | ORAL | 1 refills | Status: DC

## 2024-03-18 MED ORDER — ATORVASTATIN CALCIUM 10 MG PO TABS: 10.0000 mg | ORAL_TABLET | Freq: Every day | ORAL | 1 refills | Status: DC

## 2024-03-18 MED ORDER — LISINOPRIL-HYDROCHLOROTHIAZIDE 20-25 MG PO TABS
1.0000 | ORAL_TABLET | Freq: Every day | ORAL | 1 refills | Status: DC
Start: 2024-03-18 — End: 2024-06-26

## 2024-03-18 NOTE — Patient Instructions (Signed)
 VISIT SUMMARY:  During today's visit, we discussed your elevated blood pressure, recent eye exam findings, smoking habits, and the importance of completing your colorectal cancer screening. We also reviewed your general health maintenance needs, including necessary blood tests.  YOUR PLAN:  -HYPERTENSION: Hypertension, or high blood pressure, occurs when the force of blood against your artery walls is too high. Your blood pressure was elevated today, likely due to changes in your medication schedule during Ramadan. Please recheck your blood pressure and continue your current antihypertensive regimen.  -PREDIABETES: Prediabetes is a condition where blood sugar levels are higher than normal but not yet high enough to be diagnosed as diabetes. Your Hemoglobin A1c has increased slightly, indicating worsening glucose control. We recommend lifestyle modifications, including regular exercise and reducing starch intake.  -TOBACCO USE: Smoking can lead to numerous health issues, including lung cancer and heart disease. You have expressed a desire to quit smoking without medication. We discussed various smoking cessation options and support available to you.  -LUNG CANCER SCREENING: Due to your smoking history, you qualify for lung cancer screening. Although you declined a CT scan today, we will revisit this discussion at your next visit.  -COLORECTAL CANCER SCREENING: Colorectal cancer screening is important as the risk increases with age. You have not yet completed the Cologuard stool test. We encourage you to complete this test as soon as possible.  -GENERAL HEALTH MAINTENANCE: Routine blood tests are necessary to check your cholesterol, kidney, and liver function. We have ordered these tests for you.  INSTRUCTIONS:  Please recheck your blood pressure and continue your current antihypertensive regimen. Complete the Cologuard stool test for colorectal cancer screening. We will revisit the lung cancer  screening discussion at your next visit. Additionally, please get the ordered blood tests to check your cholesterol, kidney, and liver function.

## 2024-03-18 NOTE — Progress Notes (Signed)
 Subjective:  Patient ID: Frank Warren, male    DOB: Sep 24, 1961  Age: 63 y.o. MRN: [id number]  CC: Medical Management of Chronic Issues     Discussed the use of AI scribe software for clinical note transcription with the patient, who gave verbal consent to proceed.  History of Present Illness The patient, with a history of hypertension, GERD, nicotine dependence  , presents with elevated blood pressure. He reports adherence to his antihypertensive regimen, taking his medications at night. However, due to fasting for Ramadan, he has adjusted his medication schedule, taking his hydralazine and clonidine, both typically three times daily, only once at night.  In addition, the patient reports a recent eye exam where the ophthalmologist noted findings suggestive of prediabetes or diabetes. The patient is unsure if these results were communicated to his primary care provider. His prediabetes is managed with diet and his A1c is 6.2 up from 6.0 previously. He is adherent with his statin.  The patient also acknowledges receipt of a Cologuard test for colon cancer screening but has not yet completed it. He expresses a desire to quit smoking, having smoked approximately half a pack a day for nearly forty years, but declines pharmacological assistance at this time.    Past Medical History:  Diagnosis Date   Acid reflux disease    Dyslipidemia    Hyperlipidemia    Hypertension    Tobacco abuse     History reviewed. No pertinent surgical history.  History reviewed. No pertinent family history.  Social History   Socioeconomic History   Marital status: Married    Spouse name: Not on file   Number of children: Not on file   Years of education: Not on file   Highest education level: Not on file  Occupational History   Not on file  Tobacco Use   Smoking status: Every Day    Current packs/day: 0.50    Types: Cigarettes   Smokeless tobacco: Never   Tobacco comments:    Smoking 6  cigs/day  Substance and Sexual Activity   Alcohol use: No   Drug use: No   Sexual activity: Not Currently  Other Topics Concern   Not on file  Social History Narrative   Not on file   Social Drivers of Health   Financial Resource Strain: Not on file  Food Insecurity: Not on file  Transportation Needs: Not on file  Physical Activity: Not on file  Stress: Not on file  Social Connections: Not on file    No Known Allergies  Outpatient Medications Prior to Visit  Medication Sig Dispense Refill   famotidine (PEPCID) 20 MG tablet Take 1 tablet by mouth once daily 90 tablet 1   atorvastatin (LIPITOR) 10 MG tablet Take 1 tablet by mouth once daily 90 tablet 1   cloNIDine (CATAPRES) 0.1 MG tablet TAKE 1 TABLET BY MOUTH THREE TIMES DAILY 270 tablet 1   hydrALAZINE (APRESOLINE) 100 MG tablet TAKE 1/2 (ONE-HALF) TABLET BY MOUTH THREE TIMES DAILY 45 tablet 0   lisinopril-hydrochlorothiazide (ZESTORETIC) 20-25 MG tablet Take 1 tablet by mouth once daily 30 tablet 0   potassium chloride (KLOR-CON) 10 MEQ tablet Take 1 tablet by mouth once daily 90 tablet 1   tadalafil (CIALIS) 10 MG tablet Take 1 tablet (10 mg total) by mouth every other day as needed for erectile dysfunction. 10 tablet 1   No facility-administered medications prior to visit.     ROS Review of Systems  Constitutional:  Negative for  activity change and appetite change.  HENT:  Negative for sinus pressure and sore throat.   Respiratory:  Negative for chest tightness, shortness of breath and wheezing.   Cardiovascular:  Negative for chest pain and palpitations.  Gastrointestinal:  Negative for abdominal distention, abdominal pain and constipation.  Genitourinary: Negative.   Musculoskeletal: Negative.   Psychiatric/Behavioral:  Negative for behavioral problems and dysphoric mood.     Objective:  BP (!) 177/95   Pulse 60   Ht 5\' 9"  (1.753 m)   Wt 165 lb (74.8 kg)   SpO2 99%   BMI 24.37 kg/m      03/18/2024     9:46 AM 03/18/2024    9:24 AM 09/18/2023    3:16 PM  BP/Weight  Systolic BP 177 184 125  Diastolic BP 95 102 72  Wt. (Lbs)  165 166.2  BMI  24.37 kg/m2 24.54 kg/m2      Physical Exam Constitutional:      Appearance: He is well-developed.  Cardiovascular:     Rate and Rhythm: Normal rate.     Heart sounds: Normal heart sounds. No murmur heard. Pulmonary:     Effort: Pulmonary effort is normal.     Breath sounds: Normal breath sounds. No wheezing or rales.  Chest:     Chest wall: No tenderness.  Abdominal:     General: Bowel sounds are normal. There is no distension.     Palpations: Abdomen is soft. There is no mass.     Tenderness: There is no abdominal tenderness.  Musculoskeletal:        General: Normal range of motion.     Right lower leg: No edema.     Left lower leg: No edema.  Neurological:     Mental Status: He is alert and oriented to person, place, and time.  Psychiatric:        Mood and Affect: Mood normal.        Latest Ref Rng & Units 09/18/2023    3:54 PM 05/10/2023   10:16 AM 04/20/2022    9:43 AM  CMP  Glucose 70 - 99 mg/dL 161  096  045   BUN 8 - 27 mg/dL 9  11  11    Creatinine 0.76 - 1.27 mg/dL 4.09  8.11  9.14   Sodium 134 - 144 mmol/L 137  140  145   Potassium 3.5 - 5.2 mmol/L 3.6  3.4  3.6   Chloride 96 - 106 mmol/L 97  100  97   CO2 20 - 29 mmol/L 28  25  26    Calcium 8.6 - 10.2 mg/dL 9.4  9.5  9.7   Total Protein 6.0 - 8.5 g/dL  6.9    Total Bilirubin 0.0 - 1.2 mg/dL  0.3    Alkaline Phos 44 - 121 IU/L  133    AST 0 - 40 IU/L  17    ALT 0 - 44 IU/L  14      Lipid Panel     Component Value Date/Time   CHOL 117 05/10/2023 1016   TRIG 72 05/10/2023 1016   HDL 47 05/10/2023 1016   CHOLHDL 2.5 05/10/2023 1016   CHOLHDL 3.7 09/22/2016 1356   VLDL 16 09/22/2016 1356   LDLCALC 55 05/10/2023 1016    CBC    Component Value Date/Time   WBC 5.2 05/10/2023 1016   WBC 7.6 01/12/2020 0453   RBC 5.42 05/10/2023 1016   RBC 5.13 01/12/2020  0453   HGB  14.8 05/10/2023 1016   HCT 44.4 05/10/2023 1016   PLT 144 (L) 05/10/2023 1016   MCV 82 05/10/2023 1016   MCH 27.3 05/10/2023 1016   MCH 27.9 01/12/2020 0453   MCHC 33.3 05/10/2023 1016   MCHC 33.2 01/12/2020 0453   RDW 13.6 05/10/2023 1016   LYMPHSABS 1.8 10/25/2021 0837   MONOABS 0.4 01/11/2020 2046   EOSABS 0.4 10/25/2021 0837   BASOSABS 0.0 10/25/2021 9147    Lab Results  Component Value Date   HGBA1C 6.2 03/18/2024       Assessment & Plan Hypertension Blood pressure elevated at 184/102 mmHg due to non-adherence to medication during Ramadan fasting. Previously controlled at 125/72 mmHg. - Recheck blood pressure. - Continue current antihypertensive regimen. -Will have him return in 1 month to follow-up on his blood pressure -Counseled on blood pressure goal of less than 130/80, low-sodium, DASH diet, medication compliance, 150 minutes of moderate intensity exercise per week. Discussed medication compliance, adverse effects.   Prediabetes Hemoglobin A1c increased from 6.0% to 6.2%, indicating worsening glucose control. - Advise on lifestyle modifications including exercise and reducing starch intake.  Hyperlipidemia -Control -Continue statin -Check lipid panel today  Hypokalemia -Controlled -Continue potassium pills  Tobacco use Smokes half a pack per day for nearly 40 years. Prefers cessation without pharmacological aid. - Discuss smoking cessation options and support.  Lung cancer screening Qualifies for screening due to smoking history but declines CT scan. No symptoms warranting immediate testing. - Revisit lung cancer screening discussion at the next visit.  Colorectal cancer screening Due for screening. Previously declined colonoscopy and has not completed Cologuard test. Screening important as risk increases after age 17. - Encourage completion of the Cologuard stool test.  General Health Maintenance Requires blood tests to check  cholesterol, kidney, and liver function. - Order blood tests to check cholesterol, kidney, and liver function.      Meds ordered this encounter  Medications   atorvastatin (LIPITOR) 10 MG tablet    Sig: Take 1 tablet (10 mg total) by mouth daily.    Dispense:  90 tablet    Refill:  1   cloNIDine (CATAPRES) 0.1 MG tablet    Sig: Take 1 tablet (0.1 mg total) by mouth 3 (three) times daily.    Dispense:  270 tablet    Refill:  1   hydrALAZINE (APRESOLINE) 100 MG tablet    Sig: TAKE 1/2 (ONE-HALF) TABLET BY MOUTH THREE TIMES DAILY    Dispense:  45 tablet    Refill:  1   lisinopril-hydrochlorothiazide (ZESTORETIC) 20-25 MG tablet    Sig: Take 1 tablet by mouth daily.    Dispense:  90 tablet    Refill:  1   potassium chloride (KLOR-CON) 10 MEQ tablet    Sig: Take 1 tablet (10 mEq total) by mouth daily.    Dispense:  90 tablet    Refill:  1   tadalafil (CIALIS) 10 MG tablet    Sig: Take 1 tablet (10 mg total) by mouth every other day as needed for erectile dysfunction.    Dispense:  10 tablet    Refill:  1    Follow-up: Return in about 1 month (around 04/18/2024) for Blood Pressure follow-up with PCP.       Hoy Register, MD, FAAFP. [facility] [place], [place] [phone]   03/18/2024, 12:42 PM

## 2024-03-19 ENCOUNTER — Encounter: Payer: Self-pay | Admitting: Family Medicine

## 2024-03-19 LAB — CMP14+EGFR
ALT: 12 IU/L (ref 0–44)
AST: 17 IU/L (ref 0–40)
Albumin: 4.2 g/dL (ref 3.9–4.9)
Alkaline Phosphatase: 140 IU/L — ABNORMAL HIGH (ref 44–121)
BUN/Creatinine Ratio: 12 (ref 10–24)
BUN: 11 mg/dL (ref 8–27)
Bilirubin Total: 0.4 mg/dL (ref 0.0–1.2)
CO2: 26 mmol/L (ref 20–29)
Calcium: 9.5 mg/dL (ref 8.6–10.2)
Chloride: 98 mmol/L (ref 96–106)
Creatinine, Ser: 0.94 mg/dL (ref 0.76–1.27)
Globulin, Total: 2.5 g/dL (ref 1.5–4.5)
Glucose: 136 mg/dL — ABNORMAL HIGH (ref 70–99)
Potassium: 3.8 mmol/L (ref 3.5–5.2)
Sodium: 139 mmol/L (ref 134–144)
Total Protein: 6.7 g/dL (ref 6.0–8.5)
eGFR: 91 mL/min/{1.73_m2} (ref >59)

## 2024-03-19 LAB — LP+NON-HDL CHOLESTEROL
Cholesterol, Total: 127 mg/dL (ref 100–199)
HDL: 41 mg/dL (ref >39)
LDL Chol Calc (NIH): 58 mg/dL (ref 0–99)
Total Non-HDL-Chol (LDL+VLDL): 86 mg/dL (ref 0–129)
Triglycerides: 165 mg/dL — ABNORMAL HIGH (ref 0–149)
VLDL Cholesterol Cal: 28 mg/dL (ref 5–40)

## 2024-05-07 ENCOUNTER — Ambulatory Visit: Admitting: Family Medicine

## 2024-05-23 ENCOUNTER — Other Ambulatory Visit: Payer: Self-pay | Admitting: Family Medicine

## 2024-05-23 DIAGNOSIS — I10 Essential (primary) hypertension: Secondary | ICD-10-CM

## 2024-05-23 DIAGNOSIS — K219 Gastro-esophageal reflux disease without esophagitis: Secondary | ICD-10-CM

## 2024-05-30 ENCOUNTER — Other Ambulatory Visit: Payer: Self-pay | Admitting: Family Medicine

## 2024-05-30 DIAGNOSIS — N529 Male erectile dysfunction, unspecified: Secondary | ICD-10-CM

## 2024-06-24 ENCOUNTER — Other Ambulatory Visit: Payer: Self-pay | Admitting: Family Medicine

## 2024-06-24 DIAGNOSIS — I10 Essential (primary) hypertension: Secondary | ICD-10-CM

## 2024-06-25 ENCOUNTER — Telehealth: Payer: Self-pay | Admitting: Family Medicine

## 2024-06-25 NOTE — Telephone Encounter (Signed)
 Contacted pt to confirmed appt pt answered but never said anything but hello

## 2024-06-26 ENCOUNTER — Encounter: Payer: Self-pay | Admitting: Family Medicine

## 2024-06-26 ENCOUNTER — Ambulatory Visit: Attending: Family Medicine | Admitting: Family Medicine

## 2024-06-26 VITALS — BP 136/89 | HR 71 | Resp 16 | Wt 162.2 lb

## 2024-06-26 DIAGNOSIS — I10 Essential (primary) hypertension: Secondary | ICD-10-CM | POA: Diagnosis not present

## 2024-06-26 DIAGNOSIS — Z1211 Encounter for screening for malignant neoplasm of colon: Secondary | ICD-10-CM | POA: Diagnosis not present

## 2024-06-26 DIAGNOSIS — K219 Gastro-esophageal reflux disease without esophagitis: Secondary | ICD-10-CM

## 2024-06-26 DIAGNOSIS — E876 Hypokalemia: Secondary | ICD-10-CM

## 2024-06-26 DIAGNOSIS — R42 Dizziness and giddiness: Secondary | ICD-10-CM

## 2024-06-26 DIAGNOSIS — F17210 Nicotine dependence, cigarettes, uncomplicated: Secondary | ICD-10-CM

## 2024-06-26 DIAGNOSIS — E782 Mixed hyperlipidemia: Secondary | ICD-10-CM | POA: Diagnosis not present

## 2024-06-26 MED ORDER — CLONIDINE HCL 0.1 MG PO TABS: 0.1000 mg | ORAL_TABLET | Freq: Three times a day (TID) | ORAL | 1 refills | Status: DC

## 2024-06-26 MED ORDER — POTASSIUM CHLORIDE ER 10 MEQ PO TBCR: 10.0000 meq | EXTENDED_RELEASE_TABLET | Freq: Every day | ORAL | 1 refills | Status: DC

## 2024-06-26 MED ORDER — LISINOPRIL-HYDROCHLOROTHIAZIDE 20-25 MG PO TABS: 1.0000 | ORAL_TABLET | Freq: Every day | ORAL | 1 refills | Status: DC

## 2024-06-26 MED ORDER — FAMOTIDINE 20 MG PO TABS: 20.0000 mg | ORAL_TABLET | Freq: Every day | ORAL | 1 refills | Status: DC

## 2024-06-26 MED ORDER — ATORVASTATIN CALCIUM 10 MG PO TABS: 10.0000 mg | ORAL_TABLET | Freq: Every day | ORAL | 1 refills | Status: DC

## 2024-06-26 NOTE — Patient Instructions (Signed)
 VISIT SUMMARY:  Today, we discussed your blood pressure management, occasional dizziness, and screening for colorectal and lung cancer. Your blood pressure has improved with medication adherence, and we addressed your dizziness and screening needs.  YOUR PLAN:  -HYPERTENSION: Hypertension means high blood pressure. Your blood pressure has improved to 136/89 mmHg with your current medications. Please continue taking hydralazine , lisinopril -hydrochlorothiazide , potassium chloride , and clonidine  as prescribed. We have sent a 90-day refill to your pharmacy. Monitor your blood pressure at home, especially if you feel dizzy, and rise slowly from sitting or lying positions to prevent dizziness.  -DIZZINESS: Your occasional dizziness may be due to blood pressure changes or ear wax. Please check your blood pressure when you feel dizzy. If the dizziness persists, we will examine and address any ear wax accumulation.  -COLORECTAL CANCER SCREENING: You are due for colorectal cancer screening. We will send a stool test kit to your home for this purpose.  -LUNG CANCER SCREENING: Given your significant smoking history, you qualify for lung cancer screening. We will schedule a chest CT scan for this screening.  -GENERAL HEALTH MAINTENANCE: It is important to stay hydrated, especially in hot weather. Your blood tests from March were normal.  INSTRUCTIONS:  Please schedule a follow-up appointment in six months to reassess your condition and the effectiveness of your medications.

## 2024-06-26 NOTE — Progress Notes (Signed)
 Subjective:  Patient ID: Frank Warren, male    DOB: 04-17-1961  Age: 63 y.o. MRN: [id number]  CC: Hypertension (Pt states he took bp medication this morning )     Discussed the use of AI scribe software for clinical note transcription with the patient, who gave verbal consent to proceed.  History of Present Illness Frank Warren is a 63 year old male with hypertension, GERD, nicotine  dependence  (smoked since the age of 79, seven cigarettes or more per day) who presents for blood pressure management.  His blood pressure was previously 177/96 mmHg when he had not taken his medication at his last visit. Today, after taking his medication, it is 136/89 mmHg. He is on hydralazine , lisinopril /hydrochlorothiazide , potassium, and clonidine , with a 90-day supply at home.  He experiences occasional dizziness when standing, lasting for a second without a spinning sensation. He does not check his blood pressure during these episodes. No dizziness is present at the time of the visit.  He has smoked for over 40 years, currently smoking 7-8 cigarettes per day, having reduced from a higher amount in the past. He is on atorvastatin  for hyperlipidemia and on Pepcid  for GERD.   Past Medical History:  Diagnosis Date   Acid reflux disease    Dyslipidemia    Hyperlipidemia    Hypertension    Tobacco abuse     No past surgical history on file.  No family history on file.  Social History   Socioeconomic History   Marital status: Married    Spouse name: Not on file   Number of children: Not on file   Years of education: Not on file   Highest education level: Not on file  Occupational History   Not on file  Tobacco Use   Smoking status: Every Day    Current packs/day: 0.50    Types: Cigarettes   Smokeless tobacco: Never   Tobacco comments:    Smoking 6 cigs/day  Substance and Sexual Activity   Alcohol use: No   Drug use: No   Sexual activity: Not Currently  Other Topics Concern   Not  on file  Social History Narrative   Not on file   Social Drivers of Health   Financial Resource Strain: Not on file  Food Insecurity: No Food Insecurity (06/26/2024)   Hunger Vital Sign    Worried About Running Out of Food in the Last Year: Never true    Ran Out of Food in the Last Year: Never true  Transportation Needs: No Transportation Needs (06/26/2024)   PRAPARE - Administrator, Civil Service (Medical): No    Lack of Transportation (Non-Medical): No  Physical Activity: Not on file  Stress: Not on file  Social Connections: Not on file    No Known Allergies  Outpatient Medications Prior to Visit  Medication Sig Dispense Refill   hydrALAZINE  (APRESOLINE ) 100 MG tablet TAKE 1/2 (ONE-HALF) TABLET BY MOUTH THREE TIMES DAILY 45 tablet 0   tadalafil  (CIALIS ) 10 MG tablet TAKE 1 TABLET BY MOUTH EVERY OTHER DAY AS NEEDED FOR ERECTILE DYSFUNCTION 10 tablet 0   atorvastatin  (LIPITOR) 10 MG tablet Take 1 tablet (10 mg total) by mouth daily. 90 tablet 1   cloNIDine  (CATAPRES ) 0.1 MG tablet Take 1 tablet (0.1 mg total) by mouth 3 (three) times daily. 270 tablet 1   famotidine  (PEPCID ) 20 MG tablet Take 1 tablet by mouth once daily 90 tablet 0   lisinopril -hydrochlorothiazide  (ZESTORETIC ) 20-25 MG  tablet Take 1 tablet by mouth daily. 90 tablet 1   potassium chloride  (KLOR-CON ) 10 MEQ tablet Take 1 tablet (10 mEq total) by mouth daily. 90 tablet 1   No facility-administered medications prior to visit.     ROS Review of Systems  Constitutional:  Negative for activity change and appetite change.  HENT:  Negative for sinus pressure and sore throat.   Respiratory:  Negative for chest tightness, shortness of breath and wheezing.   Cardiovascular:  Negative for chest pain and palpitations.  Gastrointestinal:  Negative for abdominal distention, abdominal pain and constipation.  Genitourinary: Negative.   Musculoskeletal: Negative.   Neurological:  Positive for dizziness.   Psychiatric/Behavioral:  Negative for behavioral problems and dysphoric mood.     Objective:  BP 136/89 (BP Location: Left Arm, Patient Position: Sitting, Cuff Size: Normal)   Pulse 71   Resp 16   Wt 162 lb 3.2 oz (73.6 kg)   SpO2 98%   BMI 23.95 kg/m      06/26/2024   10:24 AM 03/18/2024    9:46 AM 03/18/2024    9:24 AM  BP/Weight  Systolic BP 136 177 184  Diastolic BP 89 95 102  Wt. (Lbs) 162.2  165  BMI 23.95 kg/m2  24.37 kg/m2      Physical Exam Constitutional:      Appearance: He is well-developed.  Cardiovascular:     Rate and Rhythm: Normal rate.     Heart sounds: Normal heart sounds. No murmur heard. Pulmonary:     Effort: Pulmonary effort is normal.     Breath sounds: Normal breath sounds. No wheezing or rales.  Chest:     Chest wall: No tenderness.  Abdominal:     General: Bowel sounds are normal. There is no distension.     Palpations: Abdomen is soft. There is no mass.     Tenderness: There is no abdominal tenderness.  Musculoskeletal:        General: Normal range of motion.     Right lower leg: No edema.     Left lower leg: No edema.  Neurological:     Mental Status: He is alert and oriented to person, place, and time.  Psychiatric:        Mood and Affect: Mood normal.        Latest Ref Rng & Units 03/18/2024    9:56 AM 09/18/2023    3:54 PM 05/10/2023   10:16 AM  CMP  Glucose 70 - 99 mg/dL 863  873  862   BUN 8 - 27 mg/dL 11  9  11    Creatinine 0.76 - 1.27 mg/dL 9.05  9.13  9.08   Sodium 134 - 144 mmol/L 139  137  140   Potassium 3.5 - 5.2 mmol/L 3.8  3.6  3.4   Chloride 96 - 106 mmol/L 98  97  100   CO2 20 - 29 mmol/L 26  28  25    Calcium  8.6 - 10.2 mg/dL 9.5  9.4  9.5   Total Protein 6.0 - 8.5 g/dL 6.7   6.9   Total Bilirubin 0.0 - 1.2 mg/dL 0.4   0.3   Alkaline Phos 44 - 121 IU/L 140   133   AST 0 - 40 IU/L 17   17   ALT 0 - 44 IU/L 12   14     Lipid Panel     Component Value Date/Time   CHOL 127 03/18/2024 0956   TRIG 165 (H)  03/18/2024 0956   HDL 41 03/18/2024 0956   CHOLHDL 2.5 05/10/2023 1016   CHOLHDL 3.7 09/22/2016 1356   VLDL 16 09/22/2016 1356   LDLCALC 58 03/18/2024 0956    CBC    Component Value Date/Time   WBC 5.2 05/10/2023 1016   WBC 7.6 01/12/2020 0453   RBC 5.42 05/10/2023 1016   RBC 5.13 01/12/2020 0453   HGB 14.8 05/10/2023 1016   HCT 44.4 05/10/2023 1016   PLT 144 (L) 05/10/2023 1016   MCV 82 05/10/2023 1016   MCH 27.3 05/10/2023 1016   MCH 27.9 01/12/2020 0453   MCHC 33.3 05/10/2023 1016   MCHC 33.2 01/12/2020 0453   RDW 13.6 05/10/2023 1016   LYMPHSABS 1.8 10/25/2021 0837   MONOABS 0.4 01/11/2020 2046   EOSABS 0.4 10/25/2021 0837   BASOSABS 0.0 10/25/2021 0837    Lab Results  Component Value Date   HGBA1C 6.2 03/18/2024      1. Mixed hyperlipidemia Controlled Continue current regimen - atorvastatin  (LIPITOR) 10 MG tablet; Take 1 tablet (10 mg total) by mouth daily.  Dispense: 90 tablet; Refill: 1  2. Essential hypertension Controlled Continue current regimen Counseled on blood pressure goal of less than 130/80, low-sodium, DASH diet, medication compliance, 150 minutes of moderate intensity exercise per week. Discussed medication compliance, adverse effects. - cloNIDine  (CATAPRES ) 0.1 MG tablet; Take 1 tablet (0.1 mg total) by mouth 3 (three) times daily.  Dispense: 270 tablet; Refill: 1 - lisinopril -hydrochlorothiazide  (ZESTORETIC ) 20-25 MG tablet; Take 1 tablet by mouth daily.  Dispense: 90 tablet; Refill: 1  3. Gastroesophageal reflux disease without esophagitis stable - famotidine  (PEPCID ) 20 MG tablet; Take 1 tablet (20 mg total) by mouth daily.  Dispense: 90 tablet; Refill: 1  4. Hypokalemia Diuretic induced Controlled with last potassium at 3.8 - potassium chloride  (KLOR-CON ) 10 MEQ tablet; Take 1 tablet (10 mEq total) by mouth daily.  Dispense: 90 tablet; Refill: 1  5. Screening for colon cancer (Primary) - Cologuard  6. Smoking greater than 20 pack  years - CT CHEST LUNG CANCER SCREENING LOW DOSE WO CONTRAST; Future  7. Dizziness Absent at this time He is not hypotensive Advised to change positions slowly Obtain home blood pressure monitor so we can have home blood pressure readings to compare   Meds ordered this encounter  Medications   atorvastatin  (LIPITOR) 10 MG tablet    Sig: Take 1 tablet (10 mg total) by mouth daily.    Dispense:  90 tablet    Refill:  1   cloNIDine  (CATAPRES ) 0.1 MG tablet    Sig: Take 1 tablet (0.1 mg total) by mouth 3 (three) times daily.    Dispense:  270 tablet    Refill:  1   famotidine  (PEPCID ) 20 MG tablet    Sig: Take 1 tablet (20 mg total) by mouth daily.    Dispense:  90 tablet    Refill:  1   lisinopril -hydrochlorothiazide  (ZESTORETIC ) 20-25 MG tablet    Sig: Take 1 tablet by mouth daily.    Dispense:  90 tablet    Refill:  1   potassium chloride  (KLOR-CON ) 10 MEQ tablet    Sig: Take 1 tablet (10 mEq total) by mouth daily.    Dispense:  90 tablet    Refill:  1    Follow-up: No follow-ups on file.       Corrina Sabin, MD, FAAFP. [facility] [place], [place] [phone]   06/26/2024, 10:41 AM

## 2024-07-23 ENCOUNTER — Other Ambulatory Visit: Payer: Self-pay | Admitting: Family Medicine

## 2024-07-23 DIAGNOSIS — I10 Essential (primary) hypertension: Secondary | ICD-10-CM

## 2024-09-02 ENCOUNTER — Emergency Department (HOSPITAL_COMMUNITY)
Admission: EM | Admit: 2024-09-02 | Discharge: 2024-09-02 | Discharge status: Against Medical Advice | Attending: Emergency Medicine | Admitting: Emergency Medicine

## 2024-09-02 ENCOUNTER — Other Ambulatory Visit: Payer: Self-pay

## 2024-09-02 ENCOUNTER — Encounter (HOSPITAL_COMMUNITY): Payer: Self-pay

## 2024-09-02 ENCOUNTER — Emergency Department (HOSPITAL_COMMUNITY)
Admission: EM | Admit: 2024-09-02 | Discharge: 2024-09-03 | Disposition: A | Discharge status: Home / Self Care | Source: Home / Self Care | Attending: Emergency Medicine | Admitting: Emergency Medicine

## 2024-09-02 DIAGNOSIS — Z5321 Procedure and treatment not carried out due to patient leaving prior to being seen by health care provider: Secondary | ICD-10-CM | POA: Diagnosis not present

## 2024-09-02 DIAGNOSIS — F17210 Nicotine dependence, cigarettes, uncomplicated: Secondary | ICD-10-CM | POA: Insufficient documentation

## 2024-09-02 DIAGNOSIS — M545 Low back pain, unspecified: Secondary | ICD-10-CM | POA: Insufficient documentation

## 2024-09-02 DIAGNOSIS — I10 Essential (primary) hypertension: Secondary | ICD-10-CM | POA: Diagnosis not present

## 2024-09-02 MED ORDER — OXYCODONE-ACETAMINOPHEN 5-325 MG PO TABS
1.0000 | ORAL_TABLET | ORAL | Status: DC | PRN
  Administered 2024-09-02: 1 via ORAL
  Filled 2024-09-02: qty 1

## 2024-09-02 NOTE — ED Triage Notes (Signed)
 Pt came in via POV d/t pain felt in his Rt lower back since last night. Denies any recent falls, denies any urinary s/s, rates his pain 10/10 in triage, A/Ox4.

## 2024-09-02 NOTE — ED Notes (Signed)
 Patient stated he did not want to stay any longer. Patient was encouraged to stay. He stated he was going home. Patient was told to come back if need to if get worst.

## 2024-09-03 ENCOUNTER — Other Ambulatory Visit: Payer: Self-pay

## 2024-09-03 ENCOUNTER — Encounter (HOSPITAL_COMMUNITY): Payer: Self-pay | Admitting: Emergency Medicine

## 2024-09-03 LAB — URINALYSIS, COMPLETE (UACMP) WITH MICROSCOPIC
Bacteria, UA: NONE SEEN
Bilirubin Urine: NEGATIVE
Glucose, UA: NEGATIVE mg/dL
Hgb urine dipstick: NEGATIVE
Ketones, ur: NEGATIVE mg/dL
Leukocytes,Ua: NEGATIVE
Nitrite: NEGATIVE
Protein, ur: NEGATIVE mg/dL
Specific Gravity, Urine: 1.014 (ref 1.005–1.030)
pH: 5 (ref 5.0–8.0)

## 2024-09-03 MED ORDER — METHOCARBAMOL 500 MG PO TABS
1000.0000 mg | ORAL_TABLET | Freq: Once | ORAL | Status: AC
  Administered 2024-09-03: 1000 mg via ORAL
  Filled 2024-09-03: qty 2

## 2024-09-03 MED ORDER — LIDOCAINE 5 % EX PTCH: 1.0000 | MEDICATED_PATCH | CUTANEOUS | 0 refills | Status: AC

## 2024-09-03 MED ORDER — METHOCARBAMOL 500 MG PO TABS: 500.0000 mg | ORAL_TABLET | Freq: Three times a day (TID) | ORAL | 0 refills | Status: AC | PRN

## 2024-09-03 MED ORDER — NAPROXEN 500 MG PO TABS: 500.0000 mg | ORAL_TABLET | Freq: Two times a day (BID) | ORAL | 0 refills | Status: AC

## 2024-09-03 NOTE — ED Triage Notes (Signed)
 Pt to ED from home c/o right lower back pain since last night.  States noticed after getting home from work, denies injury.  Denies urinary symptoms.  States pain is worse with movement, bending, twisting.  Pt went to [facility] earlier for same but left before being seen d/t wait time.

## 2024-09-03 NOTE — Discharge Instructions (Signed)
 You were evaluated in the Emergency Department and after careful evaluation, we did not find any emergent condition requiring admission or further testing in the hospital.  Your exam/testing today is overall reassuring.  Symptoms likely due to muscle strain or spasm.  Recommend using the Naprosyn twice daily as prescribed for pain.  Can use the Robaxin muscle relaxer for more significant pain, best used at night if you are having trouble sleeping as it can cause drowsiness.  Can also use the numbing patches daily.  Please return to the Emergency Department if you experience any worsening of your condition.   Thank you for allowing [place] to be a part of your care.

## 2024-09-03 NOTE — ED Provider Notes (Signed)
 [facility] Emergency Department Provider Note MRN:  [id number]  Arrival date & time: 09/03/24     Chief Complaint   Back Pain   History of Present Illness   Frank Warren is a 63 y.o. year-old male with a history of hypertension presenting to the ED with chief complaint of back pain.  Had some mild right lower back pain at work couple days ago, will go away.  Woke up from a nap and it was much worse today, very stiff.  No numbness or weakness to the arms or legs, no bowel or bladder dysfunction, no fever, no trauma.  Review of Systems  A thorough review of systems was obtained and all systems are negative except as noted in the HPI and PMH.   Patient's Health History    Past Medical History:  Diagnosis Date   Acid reflux disease    Dyslipidemia    Hyperlipidemia    Hypertension    Tobacco abuse     History reviewed. No pertinent surgical history.  History reviewed. No pertinent family history.  Social History   Socioeconomic History   Marital status: Married    Spouse name: Not on file   Number of children: Not on file   Years of education: Not on file   Highest education level: Not on file  Occupational History   Not on file  Tobacco Use   Smoking status: Every Day    Current packs/day: 0.50    Types: Cigarettes   Smokeless tobacco: Never   Tobacco comments:    Smoking 6 cigs/day  Substance and Sexual Activity   Alcohol use: No   Drug use: No   Sexual activity: Not Currently  Other Topics Concern   Not on file  Social History Narrative   Not on file   Social Drivers of Health   Financial Resource Strain: Not on file  Food Insecurity: No Food Insecurity (06/26/2024)   Hunger Vital Sign    Worried About Running Out of Food in the Last Year: Never true    Ran Out of Food in the Last Year: Never true  Transportation Needs: No Transportation Needs (06/26/2024)   PRAPARE - Administrator, Civil Service (Medical): No    Lack  of Transportation (Non-Medical): No  Physical Activity: Not on file  Stress: Not on file  Social Connections: Not on file  Intimate Partner Violence: Not At Risk (06/26/2024)   Humiliation, Afraid, Rape, and Kick questionnaire    Fear of Current or Ex-Partner: No    Emotionally Abused: No    Physically Abused: No    Sexually Abused: No     Physical Exam   Vitals:   09/03/24 0001  BP: (!) 191/91  Pulse: 64  Resp: 18  Temp: 98.4 F (36.9 C)  SpO2: 100%    CONSTITUTIONAL: Well-appearing, NAD NEURO/PSYCH:  Alert and oriented x 3, no focal deficits EYES:  eyes equal and reactive ENT/NECK:  no LAD, no JVD CARDIO: Regular rate, well-perfused, normal S1 and S2 PULM:  CTAB no wheezing or rhonchi GI/GU:  non-distended, non-tender MSK/SPINE:  No gross deformities, no edema SKIN:  no rash, atraumatic   *Additional and/or pertinent findings included in MDM below  Diagnostic and Interventional Summary    EKG Interpretation Date/Time:    Ventricular Rate:    PR Interval:    QRS Duration:    QT Interval:    QTC Calculation:   R Axis:  Text Interpretation:         Labs Reviewed  URINALYSIS, COMPLETE (UACMP) WITH MICROSCOPIC    No orders to display    Medications  methocarbamol  (ROBAXIN ) tablet 1,000 mg (has no administration in time range)     Procedures  /  Critical Care Procedures  ED Course and Medical Decision Making  Initial Impression and Ddx Nonradiating right lumbar back pain seems consistent with muscle strain or spasm.  No midline tenderness, no trauma to warrant imaging.  No red flag symptoms to suggest myelopathy.  Past medical/surgical history that increases complexity of ED encounter: Hypertension  Interpretation of Diagnostics Laboratory and/or imaging options to aid in the diagnosis/care of the patient were considered.  After careful history and physical examination, it was determined that there was no indication for diagnostics at this  time.  Patient Reassessment and Ultimate Disposition/Management     Discharge  Patient management required discussion with the following services or consulting groups:  None  Complexity of Problems Addressed Acute complicated illness or Injury  Additional Data Reviewed and Analyzed Further history obtained from: Further history from spouse/family member  Additional Factors Impacting ED Encounter Risk Prescriptions  Ozell HERO. Theadore, MD [facility] Emergency Medicine [facility] [email or web address]  Final Clinical Impressions(s) / ED Diagnoses     ICD-10-CM   1. Acute right-sided low back pain without sciatica  M54.50       ED Discharge Orders          Ordered    methocarbamol  (ROBAXIN ) 500 MG tablet  Every 8 hours PRN        09/03/24 0136    naproxen  (NAPROSYN ) 500 MG tablet  2 times daily        09/03/24 0136    lidocaine  (LIDODERM ) 5 %  Every 24 hours        09/03/24 0136             Discharge Instructions Discussed with and Provided to Patient:    Discharge Instructions      You were evaluated in the Emergency Department and after careful evaluation, we did not find any emergent condition requiring admission or further testing in the hospital.  Your exam/testing today is overall reassuring.  Symptoms likely due to muscle strain or spasm.  Recommend using the Naprosyn  twice daily as prescribed for pain.  Can use the Robaxin  muscle relaxer for more significant pain, best used at night if you are having trouble sleeping as it can cause drowsiness.  Can also use the numbing patches daily.  Please return to the Emergency Department if you experience any worsening of your condition.   Thank you for allowing us  to be a part of your care.      Theadore Ozell HERO, MD 09/03/24 [phone]

## 2024-12-31 ENCOUNTER — Telehealth: Payer: Self-pay | Admitting: Family Medicine

## 2024-12-31 NOTE — Telephone Encounter (Signed)
 Pt unconfirmed appt (per vr left vm)

## 2025-01-01 ENCOUNTER — Encounter: Payer: Self-pay | Admitting: Family Medicine

## 2025-01-01 ENCOUNTER — Ambulatory Visit: Attending: Family Medicine | Admitting: Family Medicine

## 2025-01-01 VITALS — BP 132/74 | HR 74 | Temp 97.5°F | Wt 163.2 lb

## 2025-01-01 DIAGNOSIS — K219 Gastro-esophageal reflux disease without esophagitis: Secondary | ICD-10-CM | POA: Diagnosis not present

## 2025-01-01 DIAGNOSIS — F17210 Nicotine dependence, cigarettes, uncomplicated: Secondary | ICD-10-CM | POA: Diagnosis not present

## 2025-01-01 DIAGNOSIS — Z1211 Encounter for screening for malignant neoplasm of colon: Secondary | ICD-10-CM | POA: Diagnosis not present

## 2025-01-01 DIAGNOSIS — E782 Mixed hyperlipidemia: Secondary | ICD-10-CM | POA: Diagnosis not present

## 2025-01-01 DIAGNOSIS — I10 Essential (primary) hypertension: Secondary | ICD-10-CM | POA: Diagnosis not present

## 2025-01-01 DIAGNOSIS — E876 Hypokalemia: Secondary | ICD-10-CM

## 2025-01-01 DIAGNOSIS — R7303 Prediabetes: Secondary | ICD-10-CM | POA: Diagnosis not present

## 2025-01-01 LAB — POCT GLYCOSYLATED HEMOGLOBIN (HGB A1C): HbA1c, POC (prediabetic range): 6.3 % (ref 5.7–6.4)

## 2025-01-01 MED ORDER — POTASSIUM CHLORIDE ER 10 MEQ PO TBCR: 10.0000 meq | EXTENDED_RELEASE_TABLET | Freq: Every day | ORAL | 1 refills | Status: AC

## 2025-01-01 MED ORDER — METFORMIN HCL 500 MG PO TABS: 500.0000 mg | ORAL_TABLET | Freq: Every day | ORAL | 1 refills | Status: AC

## 2025-01-01 MED ORDER — CLONIDINE HCL 0.1 MG PO TABS: 0.1000 mg | ORAL_TABLET | Freq: Three times a day (TID) | ORAL | 1 refills | Status: AC

## 2025-01-01 MED ORDER — HYDRALAZINE HCL 100 MG PO TABS: ORAL_TABLET | ORAL | 0 refills | Status: AC

## 2025-01-01 MED ORDER — FAMOTIDINE 20 MG PO TABS: 20.0000 mg | ORAL_TABLET | Freq: Every day | ORAL | 1 refills | Status: AC

## 2025-01-01 MED ORDER — ATORVASTATIN CALCIUM 10 MG PO TABS: 10.0000 mg | ORAL_TABLET | Freq: Every day | ORAL | 1 refills | Status: AC

## 2025-01-01 MED ORDER — LISINOPRIL-HYDROCHLOROTHIAZIDE 20-25 MG PO TABS: 1.0000 | ORAL_TABLET | Freq: Every day | ORAL | 1 refills | Status: AC

## 2025-01-01 NOTE — Patient Instructions (Signed)
 VISIT SUMMARY:  You came in today for a follow-up visit regarding your hypertension and overall health. You reported feeling well with no acute concerns. We discussed your current medications, smoking habits, and the importance of completing your cancer screenings.  YOUR PLAN:  -ESSENTIAL HYPERTENSION: Your blood pressure is well-controlled with your current medications. Continue taking Hydralazine , Clonidine , and Lisinopril -hydrochlorothiazide  as prescribed.  -PREDIABETES: Your A1c level has slightly increased, indicating you are progressing towards diabetes. We discussed lifestyle changes and started you on Metformin  once daily to help manage your blood sugar levels. Try to reduce your intake of starches and sweets, and increase your exercise.  -MIXED HYPERLIPIDEMIA: Your cholesterol levels were normal in March, but we need to recheck them. A fasting blood test for cholesterol is scheduled for January 12th, 2026.  -GASTROESOPHAGEAL REFLUX DISEASE: You are managing your symptoms with Famotidine . Continue taking Famotidine  20 MG orally every day.  -HYPOKALEMIA: You have low potassium levels. Continue taking Potassium chloride  10 MEQ orally every day.  -NICOTINE  DEPENDENCE, CIGARETTES: You are currently smoking 7-8 cigarettes per day and are considering quitting. We provided you with contact information to schedule a CT scan for lung cancer screening and encouraged you to quit smoking.  -GENERAL HEALTH MAINTENANCE: We discussed the importance of flu and pneumonia vaccinations, especially due to your smoking and age. You declined both vaccinations, but I encourage you to reconsider for your health.  INSTRUCTIONS:  Please complete the fasting blood test for cholesterol on January 12th, 2026. Also, schedule your CT scan for lung cancer screening as soon as possible. Continue taking all your medications as prescribed and try to make the recommended lifestyle changes. Follow up with us  if you have  any concerns or need further assistance.

## 2025-01-01 NOTE — Progress Notes (Signed)
 "  Subjective:  Patient ID: Frank Warren, male    DOB: [birth year]  Age: 64 y.o. MRN: [id number]  CC: Medical Management of Chronic Issues     Discussed the use of AI scribe software for clinical note transcription with the patient, who gave verbal consent to proceed.  History of Present Illness Frank Warren is a 64 year old male with hypertension, GERD, nicotine  dependence  (smoked since the age of 78, seven cigarettes or more per day) who presents for a follow-up visit.  He feels well and has no acute concerns. He has not yet completed the CT lung cancer screening or the stool test for colon cancer screening, as he misplaced the kit.  He smokes about seven to eight cigarettes per day. He is thinking about quitting but is not ready and does not want pharmacologic or other cessation aids at this time.  He is taking atorvastatin , clonidine , hydralazine , lisinopril /hydrochlorothiazide , potassium, and famotidine . His last labs were in March of last year with normal cholesterol. His A1c rose slightly from 6.2 to 6.3. He finds it difficult to cut down on starches and sweets because most of his meals are high in these foods.  He sometimes has leg pain when he stands for long hours at work but no intermittent claudication.    Past Medical History:  Diagnosis Date   Acid reflux disease    Dyslipidemia    Hyperlipidemia    Hypertension    Tobacco abuse     No past surgical history on file.  No family history on file.  Social History   Socioeconomic History   Marital status: Married    Spouse name: Not on file   Number of children: Not on file   Years of education: Not on file   Highest education level: Not on file  Occupational History   Not on file  Tobacco Use   Smoking status: Every Day    Current packs/day: 0.50    Types: Cigarettes   Smokeless tobacco: Never   Tobacco comments:    Smoking 6 cigs/day  Substance and Sexual Activity   Alcohol use: No   Drug use: No    Sexual activity: Not Currently  Other Topics Concern   Not on file  Social History Narrative   Not on file   Social Drivers of Health   Tobacco Use: High Risk (09/03/2024)   Patient History    Smoking Tobacco Use: Every Day    Smokeless Tobacco Use: Never    Passive Exposure: Not on file  Financial Resource Strain: Not on file  Food Insecurity: No Food Insecurity (06/26/2024)   Epic    Worried About Programme Researcher, Broadcasting/film/video in the Last Year: Never true    Ran Out of Food in the Last Year: Never true  Transportation Needs: No Transportation Needs (06/26/2024)   Epic    Lack of Transportation (Medical): No    Lack of Transportation (Non-Medical): No  Physical Activity: Not on file  Stress: Not on file  Social Connections: Not on file  Depression (EYV7-0): Low Risk (06/26/2024)   Depression (PHQ2-9)    PHQ-2 Score: 0  Alcohol Screen: Not on file  Housing: Low Risk (06/26/2024)   Epic    Unable to Pay for Housing in the Last Year: No    Number of Times Moved in the Last Year: 0    Homeless in the Last Year: No  Utilities: Not At Risk (06/26/2024)   Epic  Threatened with loss of utilities: No  Health Literacy: Not on file    Allergies[1]  Outpatient Medications Prior to Visit  Medication Sig Dispense Refill   lidocaine  (LIDODERM ) 5 % Place 1 patch onto the skin daily. Remove & Discard patch within 12 hours or as directed by MD 5 patch 0   methocarbamol  (ROBAXIN ) 500 MG tablet Take 1 tablet (500 mg total) by mouth every 8 (eight) hours as needed for muscle spasms. 30 tablet 0   naproxen  (NAPROSYN ) 500 MG tablet Take 1 tablet (500 mg total) by mouth 2 (two) times daily. 30 tablet 0   tadalafil  (CIALIS ) 10 MG tablet TAKE 1 TABLET BY MOUTH EVERY OTHER DAY AS NEEDED FOR ERECTILE DYSFUNCTION 10 tablet 0   atorvastatin  (LIPITOR) 10 MG tablet Take 1 tablet (10 mg total) by mouth daily. 90 tablet 1   cloNIDine  (CATAPRES ) 0.1 MG tablet Take 1 tablet (0.1 mg total) by mouth 3 (three) times daily.  270 tablet 1   famotidine  (PEPCID ) 20 MG tablet Take 1 tablet (20 mg total) by mouth daily. 90 tablet 1   hydrALAZINE  (APRESOLINE ) 100 MG tablet TAKE 1/2 (ONE-HALF) TABLET BY MOUTH THREE TIMES DAILY 45 tablet 0   lisinopril -hydrochlorothiazide  (ZESTORETIC ) 20-25 MG tablet Take 1 tablet by mouth daily. 90 tablet 1   potassium chloride  (KLOR-CON ) 10 MEQ tablet Take 1 tablet (10 mEq total) by mouth daily. 90 tablet 1   No facility-administered medications prior to visit.     ROS Review of Systems  Constitutional:  Negative for activity change and appetite change.  HENT:  Negative for sinus pressure and sore throat.   Respiratory:  Negative for chest tightness, shortness of breath and wheezing.   Cardiovascular:  Negative for chest pain and palpitations.  Gastrointestinal:  Negative for abdominal distention, abdominal pain and constipation.  Genitourinary: Negative.   Musculoskeletal: Negative.   Psychiatric/Behavioral:  Negative for behavioral problems and dysphoric mood.     Objective:  BP 132/74   Pulse 74   Temp (!) 97.5 F (36.4 C) (Oral)   Wt 163 lb 3.2 oz (74 kg)   SpO2 100%   BMI 25.56 kg/m      01/01/2025   10:35 AM 09/03/2024    2:03 AM 09/03/2024   12:26 AM  BP/Weight  Systolic BP 132 178   Diastolic BP 74 87   Wt. (Lbs) 163.2  165  BMI 25.56 kg/m2  25.84 kg/m2      Physical Exam Constitutional:      Appearance: He is well-developed.  Cardiovascular:     Rate and Rhythm: Normal rate.     Heart sounds: Normal heart sounds. No murmur heard. Pulmonary:     Effort: Pulmonary effort is normal.     Breath sounds: Normal breath sounds. No wheezing or rales.  Chest:     Chest wall: No tenderness.  Abdominal:     General: Bowel sounds are normal. There is no distension.     Palpations: Abdomen is soft. There is no mass.     Tenderness: There is no abdominal tenderness.  Musculoskeletal:        General: Normal range of motion.     Right lower leg: No edema.      Left lower leg: No edema.  Neurological:     Mental Status: He is alert and oriented to person, place, and time.  Psychiatric:        Mood and Affect: Mood normal.  Latest Ref Rng & Units 03/18/2024    9:56 AM 09/18/2023    3:54 PM 05/10/2023   10:16 AM  CMP  Glucose 70 - 99 mg/dL 863  873  862   BUN 8 - 27 mg/dL 11  9  11    Creatinine 0.76 - 1.27 mg/dL 9.05  9.13  9.08   Sodium 134 - 144 mmol/L 139  137  140   Potassium 3.5 - 5.2 mmol/L 3.8  3.6  3.4   Chloride 96 - 106 mmol/L 98  97  100   CO2 20 - 29 mmol/L 26  28  25    Calcium  8.6 - 10.2 mg/dL 9.5  9.4  9.5   Total Protein 6.0 - 8.5 g/dL 6.7   6.9   Total Bilirubin 0.0 - 1.2 mg/dL 0.4   0.3   Alkaline Phos 44 - 121 IU/L 140   133   AST 0 - 40 IU/L 17   17   ALT 0 - 44 IU/L 12   14     Lipid Panel     Component Value Date/Time   CHOL 127 03/18/2024 0956   TRIG 165 (H) 03/18/2024 0956   HDL 41 03/18/2024 0956   CHOLHDL 2.5 05/10/2023 1016   CHOLHDL 3.7 09/22/2016 1356   VLDL 16 09/22/2016 1356   LDLCALC 58 03/18/2024 0956    CBC    Component Value Date/Time   WBC 5.2 05/10/2023 1016   WBC 7.6 01/12/2020 0453   RBC 5.42 05/10/2023 1016   RBC 5.13 01/12/2020 0453   HGB 14.8 05/10/2023 1016   HCT 44.4 05/10/2023 1016   PLT 144 (L) 05/10/2023 1016   MCV 82 05/10/2023 1016   MCH 27.3 05/10/2023 1016   MCH 27.9 01/12/2020 0453   MCHC 33.3 05/10/2023 1016   MCHC 33.2 01/12/2020 0453   RDW 13.6 05/10/2023 1016   LYMPHSABS 1.8 10/25/2021 0837   MONOABS 0.4 01/11/2020 2046   EOSABS 0.4 10/25/2021 0837   BASOSABS 0.0 10/25/2021 9162    Lab Results  Component Value Date   HGBA1C 6.3 01/01/2025   Lab Results  Component Value Date   HGBA1C 6.3 01/01/2025   HGBA1C 6.2 03/18/2024   HGBA1C 6.0 (H) 05/10/2023        Assessment & Plan Essential hypertension Blood pressure well-controlled with current medication regimen. - Continue current antihypertensive medications: Hydralazine , Clonidine ,  Lisinopril -hydrochlorothiazide . -Counseled on blood pressure goal of less than 130/80, low-sodium, DASH diet, medication compliance, 150 minutes of moderate intensity exercise per week. Discussed medication compliance, adverse effects.   Prediabetes A1c increased from 6.2 to 6.3, indicating progression towards diabetes. Discussed lifestyle modifications and metformin  as treatment options. He prefers metformin  due to difficulty in dietary changes. - Prescribed Metformin  once daily. - Encouraged lifestyle modifications including reducing starch and sweets, and increasing exercise.  Mixed hyperlipidemia Cholesterol levels were normal in March. Plan to recheck cholesterol levels with fasting blood test. - Scheduled fasting blood test for cholesterol on January 12th, 2026.  Gastroesophageal reflux disease Continues to manage symptoms with Famotidine . - Continue Famotidine  20 MG oral daily.  Hypokalemia Last potassium was 3.8 Continues to take Potassium chloride  as prescribed. - Continue Potassium chloride  10 MEQ oral daily.  Smoking greater than 20 pack year history Currently smoking 7-8 cigarettes per day. He is undecided about quitting but is considering it. Declined referral for smoking cessation support. - Provided contact information for scheduling CT scan for lung cancer screening. - Encouraged smoking cessation.  General  Health Maintenance Discussed the importance of flu and pneumonia vaccinations due to smoking and age. He declined both vaccinations. - Encouraged flu and pneumonia vaccinations.       Meds ordered this encounter  Medications   atorvastatin  (LIPITOR) 10 MG tablet    Sig: Take 1 tablet (10 mg total) by mouth daily.    Dispense:  90 tablet    Refill:  1   cloNIDine  (CATAPRES ) 0.1 MG tablet    Sig: Take 1 tablet (0.1 mg total) by mouth 3 (three) times daily.    Dispense:  270 tablet    Refill:  1   famotidine  (PEPCID ) 20 MG tablet    Sig: Take 1 tablet  (20 mg total) by mouth daily.    Dispense:  90 tablet    Refill:  1   hydrALAZINE  (APRESOLINE ) 100 MG tablet    Sig: TAKE 1/2 (ONE-HALF) TABLET BY MOUTH THREE TIMES DAILY    Dispense:  45 tablet    Refill:  0   lisinopril -hydrochlorothiazide  (ZESTORETIC ) 20-25 MG tablet    Sig: Take 1 tablet by mouth daily.    Dispense:  90 tablet    Refill:  1   potassium chloride  (KLOR-CON ) 10 MEQ tablet    Sig: Take 1 tablet (10 mEq total) by mouth daily.    Dispense:  90 tablet    Refill:  1   metFORMIN  (GLUCOPHAGE ) 500 MG tablet    Sig: Take 1 tablet (500 mg total) by mouth daily with breakfast.    Dispense:  90 tablet    Refill:  1    Follow-up: Return in about 6 months (around 07/01/2025) for Chronic medical conditions.       Corrina Sabin, MD, FAAFP. [facility] [place], [place] [phone]   01/01/2025, 11:05 AM    [1] No Known Allergies  "

## 2025-07-01 ENCOUNTER — Ambulatory Visit: Payer: Self-pay | Admitting: Family Medicine
